# Patient Record
Sex: Female | Born: 1960
Health system: Southern US, Community
[De-identification: ages and names within clinical notes are randomized; demographics above are authoritative.]

## PROBLEM LIST (undated history)

## (undated) DIAGNOSIS — E039 Hypothyroidism, unspecified: Secondary | ICD-10-CM

## (undated) DIAGNOSIS — M542 Cervicalgia: Secondary | ICD-10-CM

## (undated) DIAGNOSIS — F329 Major depressive disorder, single episode, unspecified: Secondary | ICD-10-CM

## (undated) DIAGNOSIS — G629 Polyneuropathy, unspecified: Principal | ICD-10-CM

## (undated) DIAGNOSIS — F411 Generalized anxiety disorder: Secondary | ICD-10-CM

## (undated) DIAGNOSIS — D649 Anemia, unspecified: Secondary | ICD-10-CM

## (undated) DIAGNOSIS — M5416 Radiculopathy, lumbar region: Secondary | ICD-10-CM

## (undated) DIAGNOSIS — L309 Dermatitis, unspecified: Secondary | ICD-10-CM

## (undated) DIAGNOSIS — M8580 Other specified disorders of bone density and structure, unspecified site: Secondary | ICD-10-CM

## (undated) DIAGNOSIS — M1990 Unspecified osteoarthritis, unspecified site: Secondary | ICD-10-CM

## (undated) DIAGNOSIS — F3289 Other specified depressive episodes: Secondary | ICD-10-CM

## (undated) HISTORY — DX: Anemia, unspecified: D64.9

## (undated) HISTORY — DX: Polyneuropathy, unspecified: G62.9

## (undated) HISTORY — PX: COLONOSCOPY: SHX174

## (undated) HISTORY — DX: Hypothyroidism, unspecified: E03.9

## (undated) HISTORY — DX: Other specified depressive episodes: F32.89

## (undated) HISTORY — PX: BREAST SURGERY: SHX581

## (undated) HISTORY — DX: Unspecified osteoarthritis, unspecified site: M19.90

## (undated) HISTORY — DX: Generalized anxiety disorder: F41.1

## (undated) HISTORY — DX: Other specified disorders of bone density and structure, unspecified site: M85.80

## (undated) HISTORY — DX: Dermatitis, unspecified: L30.9

## (undated) HISTORY — DX: Cervicalgia: M54.2

## (undated) HISTORY — PX: TONSILLECTOMY: SHX5217

## (undated) HISTORY — DX: Radiculopathy, lumbar region: M54.16

## (undated) HISTORY — DX: Major depressive disorder, single episode, unspecified: F32.9

---

## 2003-06-17 ENCOUNTER — Encounter: Payer: Self-pay | Admitting: Internal Medicine

## 2003-10-02 ENCOUNTER — Other Ambulatory Visit: Admission: RE | Admit: 2003-10-02 | Discharge: 2003-10-02 | Payer: Self-pay | Admitting: Family Medicine

## 2003-10-09 ENCOUNTER — Encounter: Admission: RE | Admit: 2003-10-09 | Discharge: 2003-10-23 | Payer: Self-pay | Admitting: Neurology

## 2003-10-14 ENCOUNTER — Ambulatory Visit (HOSPITAL_COMMUNITY): Admission: RE | Admit: 2003-10-14 | Discharge: 2003-10-14 | Payer: Self-pay | Admitting: Family Medicine

## 2003-10-24 ENCOUNTER — Encounter: Admission: RE | Admit: 2003-10-24 | Discharge: 2003-12-10 | Payer: Self-pay | Admitting: Neurology

## 2003-10-30 ENCOUNTER — Encounter: Admission: RE | Admit: 2003-10-30 | Discharge: 2003-10-30 | Payer: Self-pay | Admitting: Family Medicine

## 2003-12-18 ENCOUNTER — Emergency Department (HOSPITAL_COMMUNITY): Admission: EM | Admit: 2003-12-18 | Discharge: 2003-12-18 | Payer: Self-pay | Admitting: Emergency Medicine

## 2004-10-06 ENCOUNTER — Other Ambulatory Visit: Admission: RE | Admit: 2004-10-06 | Discharge: 2004-10-06 | Payer: Self-pay | Admitting: Physician Assistant

## 2005-01-26 ENCOUNTER — Other Ambulatory Visit: Admission: RE | Admit: 2005-01-26 | Discharge: 2005-01-26 | Payer: Self-pay | Admitting: Obstetrics and Gynecology

## 2005-03-03 ENCOUNTER — Encounter: Admission: RE | Admit: 2005-03-03 | Discharge: 2005-03-03 | Payer: Self-pay | Admitting: Obstetrics and Gynecology

## 2006-09-22 LAB — CONVERTED CEMR LAB: Pap Smear: NORMAL

## 2007-01-08 ENCOUNTER — Ambulatory Visit: Payer: Self-pay | Admitting: Internal Medicine

## 2007-05-27 ENCOUNTER — Telehealth: Payer: Self-pay | Admitting: Internal Medicine

## 2007-06-13 ENCOUNTER — Ambulatory Visit: Payer: Self-pay | Admitting: Internal Medicine

## 2007-06-13 LAB — CONVERTED CEMR LAB
ALT: 22 units/L (ref 0–35)
AST: 24 units/L (ref 0–37)
Albumin: 4.1 g/dL (ref 3.5–5.2)
Alkaline Phosphatase: 47 units/L (ref 39–117)
BUN: 18 mg/dL (ref 6–23)
Basophils Absolute: 0 10*3/uL (ref 0.0–0.1)
Basophils Relative: 0.1 % (ref 0.0–1.0)
Bilirubin Urine: NEGATIVE
Bilirubin, Direct: 0.2 mg/dL (ref 0.0–0.3)
Blood in Urine, dipstick: NEGATIVE
CO2: 30 meq/L (ref 19–32)
Calcium: 9.5 mg/dL (ref 8.4–10.5)
Chloride: 105 meq/L (ref 96–112)
Cholesterol: 174 mg/dL (ref 0–200)
Creatinine, Ser: 0.6 mg/dL (ref 0.4–1.2)
Eosinophils Absolute: 0.1 10*3/uL (ref 0.0–0.6)
Eosinophils Relative: 1.7 % (ref 0.0–5.0)
GFR calc Af Amer: 138 mL/min
GFR calc non Af Amer: 114 mL/min
Glucose, Bld: 102 mg/dL — ABNORMAL HIGH (ref 70–99)
Glucose, Urine, Semiquant: NEGATIVE
HCT: 35.5 % — ABNORMAL LOW (ref 36.0–46.0)
HDL: 67.1 mg/dL (ref >39.0)
Hemoglobin: 12.3 g/dL (ref 12.0–15.0)
Ketones, urine, test strip: NEGATIVE
LDL Cholesterol: 96 mg/dL (ref 0–99)
Lymphocytes Relative: 18.9 % (ref 12.0–46.0)
MCHC: 34.6 g/dL (ref 30.0–36.0)
MCV: 89.5 fL (ref 78.0–100.0)
Monocytes Absolute: 0.5 10*3/uL (ref 0.2–0.7)
Monocytes Relative: 5.3 % (ref 3.0–11.0)
Neutro Abs: 6.3 10*3/uL (ref 1.4–7.7)
Neutrophils Relative %: 74 % (ref 43.0–77.0)
Nitrite: NEGATIVE
Platelets: 302 10*3/uL (ref 150–400)
Potassium: 4 meq/L (ref 3.5–5.1)
Protein, U semiquant: NEGATIVE
RBC: 3.97 M/uL (ref 3.87–5.11)
RDW: 12.5 % (ref 11.5–14.6)
Sodium: 143 meq/L (ref 135–145)
Specific Gravity, Urine: <1.005
TSH: 2.74 microintl units/mL (ref 0.35–5.50)
Total Bilirubin: 2 mg/dL — ABNORMAL HIGH (ref 0.3–1.2)
Total CHOL/HDL Ratio: 2.6
Total Protein: 7 g/dL (ref 6.0–8.3)
Triglycerides: 55 mg/dL (ref 0–149)
Urobilinogen, UA: 0.2
VLDL: 11 mg/dL (ref 0–40)
WBC: 8.5 10*3/uL (ref 4.5–10.5)
pH: 5.5

## 2007-06-17 DIAGNOSIS — E039 Hypothyroidism, unspecified: Secondary | ICD-10-CM | POA: Insufficient documentation

## 2007-06-17 DIAGNOSIS — F329 Major depressive disorder, single episode, unspecified: Secondary | ICD-10-CM | POA: Insufficient documentation

## 2007-06-17 DIAGNOSIS — F32A Depression, unspecified: Secondary | ICD-10-CM | POA: Insufficient documentation

## 2007-06-20 ENCOUNTER — Ambulatory Visit: Payer: Self-pay | Admitting: Internal Medicine

## 2007-06-20 DIAGNOSIS — F411 Generalized anxiety disorder: Secondary | ICD-10-CM | POA: Insufficient documentation

## 2007-09-27 ENCOUNTER — Telehealth: Payer: Self-pay | Admitting: Internal Medicine

## 2007-10-24 LAB — CONVERTED CEMR LAB: Pap Smear: NORMAL

## 2008-06-16 ENCOUNTER — Ambulatory Visit: Payer: Self-pay | Admitting: Internal Medicine

## 2008-06-17 ENCOUNTER — Encounter: Payer: Self-pay | Admitting: Internal Medicine

## 2008-06-17 LAB — CONVERTED CEMR LAB
BUN: 13 mg/dL (ref 6–23)
Basophils Absolute: 0 10*3/uL (ref 0.0–0.1)
Basophils Relative: 0 % (ref 0.0–3.0)
CO2: 28 meq/L (ref 19–32)
Calcium: 8.9 mg/dL (ref 8.4–10.5)
Chloride: 110 meq/L (ref 96–112)
Creatinine, Ser: 0.5 mg/dL (ref 0.4–1.2)
Cyclic Citrullin Peptide Ab: 0.5 units (ref <7)
Eosinophils Absolute: 0.1 10*3/uL (ref 0.0–0.7)
Eosinophils Relative: 1.9 % (ref 0.0–5.0)
GFR calc Af Amer: 170 mL/min
GFR calc non Af Amer: 141 mL/min
Glucose, Bld: 98 mg/dL (ref 70–99)
HCT: 36.3 % (ref 36.0–46.0)
Hemoglobin: 12.7 g/dL (ref 12.0–15.0)
Lymphocytes Relative: 39.9 % (ref 12.0–46.0)
MCHC: 34.9 g/dL (ref 30.0–36.0)
MCV: 90.2 fL (ref 78.0–100.0)
Monocytes Absolute: 0.4 10*3/uL (ref 0.1–1.0)
Monocytes Relative: 7.1 % (ref 3.0–12.0)
Neutro Abs: 2.7 10*3/uL (ref 1.4–7.7)
Neutrophils Relative %: 51.1 % (ref 43.0–77.0)
Platelets: 302 10*3/uL (ref 150–400)
Potassium: 3.5 meq/L (ref 3.5–5.1)
RBC: 4.02 M/uL (ref 3.87–5.11)
RDW: 11.7 % (ref 11.5–14.6)
Rhuematoid fact SerPl-aCnc: <20 intl units/mL — ABNORMAL LOW (ref 0.0–20.0)
Sodium: 140 meq/L (ref 135–145)
WBC: 5.3 10*3/uL (ref 4.5–10.5)

## 2008-06-18 LAB — CONVERTED CEMR LAB: Anti Nuclear Antibody(ANA): NEGATIVE

## 2008-07-29 ENCOUNTER — Ambulatory Visit: Payer: Self-pay | Admitting: Internal Medicine

## 2008-07-29 LAB — CONVERTED CEMR LAB
ALT: 20 units/L (ref 0–35)
AST: 22 units/L (ref 0–37)
Albumin: 4.1 g/dL (ref 3.5–5.2)
Alkaline Phosphatase: 36 units/L — ABNORMAL LOW (ref 39–117)
BUN: 14 mg/dL (ref 6–23)
Basophils Absolute: 0 10*3/uL (ref 0.0–0.1)
Basophils Relative: 0.4 % (ref 0.0–3.0)
Bilirubin Urine: NEGATIVE
Bilirubin, Direct: 0.1 mg/dL (ref 0.0–0.3)
Blood in Urine, dipstick: NEGATIVE
CO2: 28 meq/L (ref 19–32)
Calcium: 8.7 mg/dL (ref 8.4–10.5)
Chloride: 105 meq/L (ref 96–112)
Cholesterol: 190 mg/dL (ref 0–200)
Creatinine, Ser: 0.6 mg/dL (ref 0.4–1.2)
Eosinophils Absolute: 0.1 10*3/uL (ref 0.0–0.7)
Eosinophils Relative: 2.2 % (ref 0.0–5.0)
GFR calc Af Amer: 137 mL/min
GFR calc non Af Amer: 113 mL/min
Glucose, Bld: 108 mg/dL — ABNORMAL HIGH (ref 70–99)
Glucose, Urine, Semiquant: NEGATIVE
HCT: 35.2 % — ABNORMAL LOW (ref 36.0–46.0)
HDL: 57.5 mg/dL (ref >39.0)
Hemoglobin: 12.1 g/dL (ref 12.0–15.0)
Ketones, urine, test strip: NEGATIVE
LDL Cholesterol: 113 mg/dL — ABNORMAL HIGH (ref 0–99)
Lymphocytes Relative: 41.9 % (ref 12.0–46.0)
MCHC: 34.2 g/dL (ref 30.0–36.0)
MCV: 88.7 fL (ref 78.0–100.0)
Monocytes Absolute: 0.3 10*3/uL (ref 0.1–1.0)
Monocytes Relative: 6.6 % (ref 3.0–12.0)
Neutro Abs: 2.4 10*3/uL (ref 1.4–7.7)
Neutrophils Relative %: 48.9 % (ref 43.0–77.0)
Nitrite: NEGATIVE
Platelets: 294 10*3/uL (ref 150–400)
Potassium: 3.8 meq/L (ref 3.5–5.1)
Protein, U semiquant: NEGATIVE
RBC: 3.97 M/uL (ref 3.87–5.11)
RDW: 11.8 % (ref 11.5–14.6)
Sodium: 141 meq/L (ref 135–145)
Specific Gravity, Urine: 1.015
TSH: 2.71 microintl units/mL (ref 0.35–5.50)
Total Bilirubin: 1.9 mg/dL — ABNORMAL HIGH (ref 0.3–1.2)
Total CHOL/HDL Ratio: 3.3
Total Protein: 7.2 g/dL (ref 6.0–8.3)
Triglycerides: 98 mg/dL (ref 0–149)
Urobilinogen, UA: 0.2
VLDL: 20 mg/dL (ref 0–40)
WBC: 4.8 10*3/uL (ref 4.5–10.5)
pH: 7

## 2008-08-04 ENCOUNTER — Ambulatory Visit: Payer: Self-pay | Admitting: Internal Medicine

## 2008-12-18 ENCOUNTER — Encounter: Payer: Self-pay | Admitting: Internal Medicine

## 2009-01-05 ENCOUNTER — Encounter: Payer: Self-pay | Admitting: Internal Medicine

## 2009-02-23 ENCOUNTER — Encounter: Payer: Self-pay | Admitting: Internal Medicine

## 2009-07-22 ENCOUNTER — Ambulatory Visit: Payer: Self-pay | Admitting: Internal Medicine

## 2009-07-22 LAB — CONVERTED CEMR LAB
ALT: 16 units/L (ref 0–35)
AST: 20 units/L (ref 0–37)
Albumin: 3.8 g/dL (ref 3.5–5.2)
Alkaline Phosphatase: 38 units/L — ABNORMAL LOW (ref 39–117)
BUN: 14 mg/dL (ref 6–23)
Basophils Absolute: 0 10*3/uL (ref 0.0–0.1)
Basophils Relative: 0.7 % (ref 0.0–3.0)
Bilirubin Urine: NEGATIVE
Bilirubin, Direct: 0 mg/dL (ref 0.0–0.3)
Blood in Urine, dipstick: NEGATIVE
CO2: 30 meq/L (ref 19–32)
Calcium: 8.8 mg/dL (ref 8.4–10.5)
Chloride: 102 meq/L (ref 96–112)
Cholesterol: 172 mg/dL (ref 0–200)
Creatinine, Ser: 0.6 mg/dL (ref 0.4–1.2)
Eosinophils Absolute: 0.1 10*3/uL (ref 0.0–0.7)
Eosinophils Relative: 1.5 % (ref 0.0–5.0)
GFR calc non Af Amer: 112.94 mL/min (ref >60)
Glucose, Bld: 96 mg/dL (ref 70–99)
Glucose, Urine, Semiquant: NEGATIVE
HCT: 34.3 % — ABNORMAL LOW (ref 36.0–46.0)
HDL: 73.6 mg/dL (ref >39.00)
Hemoglobin: 11.5 g/dL — ABNORMAL LOW (ref 12.0–15.0)
Ketones, urine, test strip: NEGATIVE
LDL Cholesterol: 80 mg/dL (ref 0–99)
Lymphocytes Relative: 43.2 % (ref 12.0–46.0)
Lymphs Abs: 2.4 10*3/uL (ref 0.7–4.0)
MCHC: 33.5 g/dL (ref 30.0–36.0)
MCV: 89.6 fL (ref 78.0–100.0)
Monocytes Absolute: 0.3 10*3/uL (ref 0.1–1.0)
Monocytes Relative: 5.8 % (ref 3.0–12.0)
Neutro Abs: 2.7 10*3/uL (ref 1.4–7.7)
Neutrophils Relative %: 48.8 % (ref 43.0–77.0)
Nitrite: NEGATIVE
Platelets: 252 10*3/uL (ref 150.0–400.0)
Potassium: 4.2 meq/L (ref 3.5–5.1)
Protein, U semiquant: NEGATIVE
RBC: 3.83 M/uL — ABNORMAL LOW (ref 3.87–5.11)
RDW: 12.1 % (ref 11.5–14.6)
Sodium: 137 meq/L (ref 135–145)
Specific Gravity, Urine: 1.01
TSH: 3.02 microintl units/mL (ref 0.35–5.50)
Total Bilirubin: 1.2 mg/dL (ref 0.3–1.2)
Total CHOL/HDL Ratio: 2
Total Protein: 6.6 g/dL (ref 6.0–8.3)
Triglycerides: 90 mg/dL (ref 0.0–149.0)
Urobilinogen, UA: 0.2
VLDL: 18 mg/dL (ref 0.0–40.0)
WBC: 5.5 10*3/uL (ref 4.5–10.5)
pH: 7

## 2009-08-06 ENCOUNTER — Ambulatory Visit: Payer: Self-pay | Admitting: Internal Medicine

## 2009-08-06 DIAGNOSIS — D649 Anemia, unspecified: Secondary | ICD-10-CM | POA: Insufficient documentation

## 2009-08-09 LAB — CONVERTED CEMR LAB
Basophils Absolute: 0.1 10*3/uL (ref 0.0–0.1)
Basophils Relative: 1 % (ref 0.0–3.0)
Eosinophils Absolute: 0.1 10*3/uL (ref 0.0–0.7)
Eosinophils Relative: 1.4 % (ref 0.0–5.0)
Ferritin: 47 ng/mL (ref 10.0–291.0)
Folate: 8.1 ng/mL
HCT: 34.3 % — ABNORMAL LOW (ref 36.0–46.0)
Hemoglobin: 11.9 g/dL — ABNORMAL LOW (ref 12.0–15.0)
Iron: 58 ug/dL (ref 42–145)
Lymphocytes Relative: 35.9 % (ref 12.0–46.0)
Lymphs Abs: 2 10*3/uL (ref 0.7–4.0)
MCHC: 34.8 g/dL (ref 30.0–36.0)
MCV: 88 fL (ref 78.0–100.0)
Monocytes Absolute: 0.3 10*3/uL (ref 0.1–1.0)
Monocytes Relative: 5.9 % (ref 3.0–12.0)
Neutro Abs: 3 10*3/uL (ref 1.4–7.7)
Neutrophils Relative %: 55.8 % (ref 43.0–77.0)
Platelets: 264 10*3/uL (ref 150.0–400.0)
RBC: 3.9 M/uL (ref 3.87–5.11)
RDW: 12.1 % (ref 11.5–14.6)
Saturation Ratios: 14.5 % — ABNORMAL LOW (ref 20.0–50.0)
Transferrin: 285.1 mg/dL (ref 212.0–360.0)
Vitamin B-12: 506 pg/mL (ref 211–911)
WBC: 5.5 10*3/uL (ref 4.5–10.5)

## 2009-08-27 ENCOUNTER — Ambulatory Visit: Payer: Self-pay | Admitting: Gastroenterology

## 2009-08-30 ENCOUNTER — Telehealth: Payer: Self-pay | Admitting: Gastroenterology

## 2009-08-31 ENCOUNTER — Ambulatory Visit: Payer: Self-pay | Admitting: Gastroenterology

## 2009-08-31 LAB — HM COLONOSCOPY

## 2009-09-01 ENCOUNTER — Ambulatory Visit: Payer: Self-pay | Admitting: Gastroenterology

## 2009-09-01 LAB — CONVERTED CEMR LAB: Fecal Occult Bld: NEGATIVE

## 2009-09-02 ENCOUNTER — Encounter: Payer: Self-pay | Admitting: Gastroenterology

## 2009-09-24 ENCOUNTER — Ambulatory Visit: Payer: Self-pay | Admitting: Internal Medicine

## 2010-02-20 LAB — HM MAMMOGRAPHY: HM Mammogram: NORMAL

## 2010-02-20 LAB — CONVERTED CEMR LAB

## 2010-02-23 ENCOUNTER — Encounter: Payer: Self-pay | Admitting: Internal Medicine

## 2010-07-07 ENCOUNTER — Telehealth: Payer: Self-pay | Admitting: *Deleted

## 2010-08-03 ENCOUNTER — Ambulatory Visit: Payer: Self-pay | Admitting: Internal Medicine

## 2010-08-03 LAB — CONVERTED CEMR LAB
ALT: 19 units/L (ref 0–35)
AST: 21 units/L (ref 0–37)
Albumin: 4.2 g/dL (ref 3.5–5.2)
Alkaline Phosphatase: 45 units/L (ref 39–117)
BUN: 14 mg/dL (ref 6–23)
Basophils Absolute: 0 10*3/uL (ref 0.0–0.1)
Basophils Relative: 0.8 % (ref 0.0–3.0)
Bilirubin Urine: NEGATIVE
Bilirubin, Direct: 0.2 mg/dL (ref 0.0–0.3)
CO2: 31 meq/L (ref 19–32)
Calcium: 9 mg/dL (ref 8.4–10.5)
Chloride: 105 meq/L (ref 96–112)
Cholesterol: 204 mg/dL — ABNORMAL HIGH (ref 0–200)
Creatinine, Ser: 0.5 mg/dL (ref 0.4–1.2)
Direct LDL: 105.3 mg/dL
Eosinophils Absolute: 0.1 10*3/uL (ref 0.0–0.7)
Eosinophils Relative: 2.1 % (ref 0.0–5.0)
GFR calc non Af Amer: 129.77 mL/min (ref >60)
Glucose, Bld: 91 mg/dL (ref 70–99)
HCT: 35.2 % — ABNORMAL LOW (ref 36.0–46.0)
HDL: 70.9 mg/dL (ref >39.00)
Hemoglobin, Urine: NEGATIVE
Hemoglobin: 12.3 g/dL (ref 12.0–15.0)
Ketones, ur: NEGATIVE mg/dL
Lymphocytes Relative: 44.8 % (ref 12.0–46.0)
Lymphs Abs: 1.9 10*3/uL (ref 0.7–4.0)
MCHC: 35 g/dL (ref 30.0–36.0)
MCV: 89.2 fL (ref 78.0–100.0)
Monocytes Absolute: 0.3 10*3/uL (ref 0.1–1.0)
Monocytes Relative: 7.4 % (ref 3.0–12.0)
Neutro Abs: 1.9 10*3/uL (ref 1.4–7.7)
Neutrophils Relative %: 44.9 % (ref 43.0–77.0)
Nitrite: NEGATIVE
Platelets: 250 10*3/uL (ref 150.0–400.0)
Potassium: 4.3 meq/L (ref 3.5–5.1)
RBC: 3.94 M/uL (ref 3.87–5.11)
RDW: 13.5 % (ref 11.5–14.6)
Sodium: 142 meq/L (ref 135–145)
Specific Gravity, Urine: 1.01 (ref 1.000–1.030)
TSH: 2.51 microintl units/mL (ref 0.35–5.50)
Total Bilirubin: 1.9 mg/dL — ABNORMAL HIGH (ref 0.3–1.2)
Total CHOL/HDL Ratio: 3
Total Protein, Urine: NEGATIVE mg/dL
Total Protein: 7.1 g/dL (ref 6.0–8.3)
Triglycerides: 85 mg/dL (ref 0.0–149.0)
Urine Glucose: NEGATIVE mg/dL
Urobilinogen, UA: 0.2 (ref 0.0–1.0)
VLDL: 17 mg/dL (ref 0.0–40.0)
WBC: 4.3 10*3/uL — ABNORMAL LOW (ref 4.5–10.5)
pH: 6.5 (ref 5.0–8.0)

## 2010-08-08 ENCOUNTER — Ambulatory Visit: Payer: Self-pay | Admitting: Internal Medicine

## 2010-08-08 ENCOUNTER — Encounter: Payer: Self-pay | Admitting: Internal Medicine

## 2010-10-10 ENCOUNTER — Ambulatory Visit: Payer: Self-pay | Admitting: Internal Medicine

## 2010-10-10 DIAGNOSIS — M542 Cervicalgia: Secondary | ICD-10-CM | POA: Insufficient documentation

## 2010-10-13 ENCOUNTER — Encounter: Payer: Self-pay | Admitting: Internal Medicine

## 2010-11-12 ENCOUNTER — Encounter: Payer: Self-pay | Admitting: Family Medicine

## 2010-11-22 NOTE — Letter (Signed)
Summary: [facility]  [facility]   Imported By: Maryln Gottron 03/02/2010 13:10:55  _____________________________________________________________________  External Attachment:    Type:   Image     Comment:   External Document

## 2010-11-22 NOTE — Assessment & Plan Note (Signed)
Summary: New pt CPX/bcbs/#/cd   Vital Signs:  Patient profile:   50 year old female Height:      61.25 inches (155.57 cm) Weight:      120.4 pounds (54.73 kg) BMI:     22.65 O2 Sat:      97 % on Room air Temp:     97.4 degrees F (36.33 degrees C) oral Pulse rate:   68 / minute BP sitting:   102 / 68  (left arm) Cuff size:   regular  Vitals Entered By: Orlan Leavens RMA (August 08, 2010 2:12 PM)  O2 Flow:  Room air CC: New patient CPX/ Transferring from Dr. Cato Mulligan Is Patient Diabetic? No Pain Assessment Patient in pain? no        Primary Care Provider:  Birdie Sons, MD  CC:  New patient CPX/ Transferring from Dr. Cato Mulligan.  History of Present Illness: new to me and our office but known to our group, here to est care  patient is here today for annual physical. Patient feels well and has no complaints.   chronic med issues: hypothyroid - reports compliance with ongoing medical treatment and no changes in medication dose or frequency. denies adverse side effects related to current therapy.   Preventive Screening-Counseling & Management  Alcohol-Tobacco     Alcohol drinks/day: <1     Alcohol Counseling: not indicated; use of alcohol is not excessive or problematic     Smoking Status: quit > 6 months     Year Quit: 1987     Tobacco Counseling: to remain off tobacco products  Caffeine-Diet-Exercise     Does Patient Exercise: yes     Exercise Counseling: not indicated; exercise is adequate  Safety-Violence-Falls     Seat Belt Counseling: not indicated; patient wears seat belts     Violence Counseling: not indicated; no violence risk noted     Fall Risk Counseling: not indicated; no significant falls noted  Clinical Review Panels:  Prevention   Last Mammogram:  No specific mammographic evidence of malignancy.   (02/20/2010)   Last Pap Smear:  Interpretation Result:Negative for intraepithelial Lesion or Malignancy.    (02/20/2010)   Last Colonoscopy:  DONE  (08/31/2009)  Immunizations   Last Tetanus Booster:  Historical (10/23/2001)   Last Flu Vaccine:  Fluvax 3+ (08/08/2010)  Lipid Management   Cholesterol:  204 (08/03/2010)   LDL (bad choesterol):  80 (07/22/2009)   HDL (good cholesterol):  70.90 (08/03/2010)  CBC   WBC:  4.3 (08/03/2010)   RBC:  3.94 (08/03/2010)   Hgb:  12.3 (08/03/2010)   Hct:  35.2 (08/03/2010)   Platelets:  250.0 (08/03/2010)   MCV  89.2 (08/03/2010)   MCHC  35.0 (08/03/2010)   RDW  13.5 (08/03/2010)   PMN:  44.9 (08/03/2010)   Lymphs:  44.8 (08/03/2010)   Monos:  7.4 (08/03/2010)   Eosinophils:  2.1 (08/03/2010)   Basophil:  0.8 (08/03/2010)  Complete Metabolic Panel   Glucose:  91 (08/03/2010)   Sodium:  142 (08/03/2010)   Potassium:  4.3 (08/03/2010)   Chloride:  105 (08/03/2010)   CO2:  31 (08/03/2010)   BUN:  14 (08/03/2010)   Creatinine:  0.5 (08/03/2010)   Albumin:  4.2 (08/03/2010)   Total Protein:  7.1 (08/03/2010)   Calcium:  9.0 (08/03/2010)   Total Bili:  1.9 (08/03/2010)   Alk Phos:  45 (08/03/2010)   SGPT (ALT):  19 (08/03/2010)   SGOT (AST):  21 (08/03/2010)   Current Medications (verified):  1)  Levothyroxine Sodium 75 Mcg  Tabs (Levothyroxine Sodium) .... One By Mouth Daily 2)  Neurontin 300 Mg  Caps (Gabapentin) .... Take 1 Tablet By Mouth Once A Day and As Needed 3)  Melatonin 1 Mg Tabs (Melatonin) .... One Tab As Needed 4)  Glucosamine-Chondroitin 1500-1200 Mg/52ml Liqd (Glucosamine-Chondroitin) .... Once Daily 5)  Multivitamins  Tabs (Multiple Vitamin) .... Once Daily 6)  Calcium 500mg -Vit D 800 Iu .... Once Daily 7)  Vitamin D3 2000 Unit Caps (Cholecalciferol) .... Once Daily 8)  Allergy 4 Mg Tabs (Chlorpheniramine Maleate) .... As Needed 9)  Lexapro 10 Mg Tabs (Escitalopram Oxalate) .Marland Kitchen.. 1 Once Daily  Allergies (verified): 1)  ! Pcn 2)  ! Macrodantin  Past History:  Past medical, surgical, family and social histories (including risk factors) reviewed, and no  changes noted (except as noted below).  Past Medical History: Anxiety/depression Hypothyroidism Anemia Arthritis  Md roster: GI - [facility] - [facility] - bey ([place] neuro)  Past Surgical History: breast reduction Tonsillectomy    Family History: Reviewed history from 08/27/2009 and no changes required. father RA, kidney failure died age 22 mother alive and well  (some question of RA) Family History of Colon Cancer:GM Family History of Kidney Disease:Father  Social History: Reviewed history from 08/27/2009 and no changes required. Married, lives with spouse; no children originally from [place] Former Smoker, quit 1987 Alcohol use-yes Regular exercise-yes Occupation: Housewife  Review of Systems       see HPI above. I have reviewed all other systems and they were negative.   Physical Exam  General:  alert, well-developed, well-nourished, and cooperative to examination.    Head:  Normocephalic and atraumatic without obvious abnormalities. No apparent alopecia or balding. Eyes:  vision grossly intact; pupils equal, round and reactive to light.  conjunctiva and lids normal.    Ears:  normal pinnae bilaterally, without erythema, swelling, or tenderness to palpation. TMs clear, without effusion, or cerumen impaction. Hearing grossly normal bilaterally  Mouth:  teeth and gums in good repair; mucous membranes moist, without lesions or ulcers. oropharynx clear without exudate, no erythema.  Neck:  supple, full ROM, no masses, no thyromegaly; no thyroid nodules or tenderness. no JVD or carotid bruits.   Lungs:  normal respiratory effort, no intercostal retractions or use of accessory muscles; normal breath sounds bilaterally - no crackles and no wheezes.    Heart:  normal rate, regular rhythm, no murmur, and no rub. BLE without edema. normal DP pulses and normal cap refill in all 4 extremities    Abdomen:  soft, non-tender, normal bowel sounds, no distention; no masses and  no appreciable hepatomegaly or splenomegaly.   Genitalia:  defer gyn Msk:  No deformity or scoliosis noted of thoracic or lumbar spine.   Neurologic:  alert & oriented X3 and cranial nerves II-XII symetrically intact.  strength normal in all extremities, sensation intact to light touch, and gait normal. speech fluent without dysarthria or aphasia; follows commands with good comprehension.  Skin:  no rashes, vesicles, ulcers, or erythema. No nodules or irregularity to palpation.  Psych:  Oriented X3, memory intact for recent and remote, normally interactive, good eye contact, not anxious appearing, not depressed appearing, and not agitated.      Impression & Recommendations:  Problem # 1:  EXAMINATION, ROUTINE MEDICAL (ICD-V70.0) Patient has been counseled on age-appropriate routine health concerns for screening and prevention. These are reviewed and up-to-date. Immunizations are up-to-date or declined. Labs and ECG reviewed.  Orders: EKG  w/ Interpretation (93000)  Complete Medication List: 1)  Levothyroxine Sodium 75 Mcg Tabs (Levothyroxine sodium) .... One by mouth daily 2)  Neurontin 300 Mg Caps (Gabapentin) .... Take 1 tablet by mouth once a day and as needed 3)  Melatonin 1 Mg Tabs (Melatonin) .... One tab as needed 4)  Glucosamine-chondroitin 1500-1200 Mg/17ml Liqd (Glucosamine-chondroitin) .... Once daily 5)  Multivitamins Tabs (Multiple vitamin) .... Once daily 6)  Calcium 500mg -vit D 800 Iu  .... Once daily 7)  Vitamin D3 2000 Unit Caps (Cholecalciferol) .... Once daily 8)  Allergy 4 Mg Tabs (Chlorpheniramine maleate) .... As needed 9)  Lexapro 10 Mg Tabs (Escitalopram oxalate) .Marland Kitchen.. 1 once daily 10)  Meloxicam 15 Mg Tabs (Meloxicam) .Marland Kitchen.. 1 by mouth once daily x 7 days, then as needed for pain  Other Orders: Admin 1st Vaccine (78295) Flu Vaccine 17yrs + (62130)  Patient Instructions: 1)  it was good to meet you today. 2)  labs reviewed - work on cholesterol with diet and  exercise changes as discussed - exam and EKG look great 3)  use meloxicam for back and shoulder ache - your prescription has been electronically submitted to your pharmacy. Please take as directed. Contact our office if you believe you're having problems with the medication(s).  4)  omega 3 supplements and probiotics ok to use as discussed 5)  flu shot done today 6)  Please schedule a follow-up appointment annually for medicalphysical and labs, call sooner if problems.  Prescriptions: MELOXICAM 15 MG TABS (MELOXICAM) 1 by mouth once daily x 7 days, then as needed for pain  #30 x 0   Entered and Authorized by:   Newt Lukes MD   Signed by:   Newt Lukes MD on 08/08/2010   Method used:   Electronically to        [facility] # [id number] (retail)       [address]       [place], [place]  [address]       Ph: [phone]       Fax: [phone]   RxID:   [id number] LEVOTHYROXINE SODIUM 75 MCG  TABS (LEVOTHYROXINE SODIUM) one by mouth daily  #30 x 11   Entered by:   Orlan Leavens RMA   Authorized by:   Newt Lukes MD   Signed by:   Orlan Leavens RMA on 08/08/2010   Method used:   Electronically to        [facility] # [id number]* (retail)       [address]       [place], [place]  [address]       Ph: [phone]       Fax: [phone]   RxID:   [id number]    Orders Added: 1)  EKG w/ Interpretation [93000] 2)  Admin 1st Vaccine [90471] 3)  Flu Vaccine 41yrs + [42706] 4)  Est. Patient 40-64 years [23762]  Flu Vaccine Consent Questions     Do you have a history of severe allergic reactions to this vaccine? no    Any prior history of allergic reactions to egg and/or gelatin? no    Do you have a sensitivity to the preservative Thimersol? no    Do you have a past history of Guillan-Barre Syndrome? no    Do you currently have an acute febrile illness? no    Have you ever had a severe reaction to latex? no    Vaccine information given  and explained  to patient? yes    Are you currently pregnant? no    Lot Number:[id number]   Exp Date:04/22/2011   Site Given  Left Deltoid IM  .lbflu   Pap Smear  Procedure date:  02/20/2010  Findings:      Interpretation Result:Negative for intraepithelial Lesion or Malignancy.     Mammogram  Procedure date:  02/20/2010  Findings:      No specific mammographic evidence of malignancy.

## 2010-11-22 NOTE — Progress Notes (Signed)
Summary: Pt req partial refill of Levothyroxin. Pt has ov on 08/12/10  Phone Note Call from Patient Call back at Home Phone [phone]   Caller: Patient Summary of Call: Pt called and said that she is coming in for office visit on 08/12/10 and labs on 08/06/10. Pt is req a partial refill of the Levothyroxin to last until her appt date. Pls call in to [facility] [address] [facility] of [address]. [phone] Initial call taken by: Lucy Antigua,  July 07, 2010 9:50 AM    Prescriptions: LEVOTHYROXINE SODIUM 75 MCG  TABS (LEVOTHYROXINE SODIUM) one by mouth daily  #30 x 0   Entered by:   Kern Reap CMA (AAMA)   Authorized by:   Birdie Sons MD   Signed by:   Kern Reap CMA (AAMA) on 07/07/2010   Method used:   Electronically to        [id number] (retail)       [address]       [place], [place]  [address]       Ph: [phone]       Fax: [phone]   RxID:   [id number]
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
## 2010-11-24 NOTE — Letter (Signed)
Summary: [facility]  [facility]   Imported By: Lester Burley 10/20/2010 11:09:07  _____________________________________________________________________  External Attachment:    Type:   Image     Comment:   External Document

## 2011-04-17 ENCOUNTER — Encounter: Payer: Self-pay | Admitting: Internal Medicine

## 2011-04-20 ENCOUNTER — Encounter: Payer: Self-pay | Admitting: Internal Medicine

## 2011-04-20 ENCOUNTER — Ambulatory Visit (INDEPENDENT_AMBULATORY_CARE_PROVIDER_SITE_OTHER): Payer: BC Managed Care – PPO | Admitting: Internal Medicine

## 2011-04-20 DIAGNOSIS — M5430 Sciatica, unspecified side: Secondary | ICD-10-CM | POA: Insufficient documentation

## 2011-04-20 DIAGNOSIS — F329 Major depressive disorder, single episode, unspecified: Secondary | ICD-10-CM

## 2011-04-20 DIAGNOSIS — M542 Cervicalgia: Secondary | ICD-10-CM

## 2011-04-20 MED ORDER — PREDNISONE (PAK) 10 MG PO TABS: 10.0000 mg | ORAL_TABLET | ORAL | Status: DC

## 2011-04-20 MED ORDER — MELOXICAM 15 MG PO TABS: 15.0000 mg | ORAL_TABLET | Freq: Every day | ORAL | Status: DC | PRN

## 2011-04-20 MED ORDER — METHOCARBAMOL 500 MG PO TABS: 500.0000 mg | ORAL_TABLET | Freq: Three times a day (TID) | ORAL | Status: DC

## 2011-04-20 NOTE — Patient Instructions (Signed)
It was good to see you today. we'll make referral to physical therapy. Our office will contact you regarding appointment(s) once made. Pred pak for inflammation of your sciatica as discussed - ok to use robaxin for muscle tightness as needed After done with pred pak, ok to use meloxcam as needed for pain Your prescription(s) have been submitted to your pharmacy. Please take as directed and contact our office if you believe you are having problem(s) with the medication(s).
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
## 2011-04-20 NOTE — Assessment & Plan Note (Signed)
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

## 2011-04-20 NOTE — Assessment & Plan Note (Signed)
Chronic pain - current flare with myofascial spasm, no neuro deficits on exam Resume meloxicam (after pred pak for sciatica - see next) and robaxin muscle relaxer Also refer to PT

## 2011-04-20 NOTE — Assessment & Plan Note (Signed)
Mild LLE symptoms - hx same pred pak x 6 days - erx done Also PT refer as for neck pain

## 2011-07-07 ENCOUNTER — Telehealth: Payer: Self-pay | Admitting: *Deleted

## 2011-07-07 DIAGNOSIS — Z0000 Encounter for general adult medical examination without abnormal findings: Secondary | ICD-10-CM

## 2011-07-07 NOTE — Telephone Encounter (Signed)
Received staff msg pt schedule cpx for 08/14/11. Need labs entered.Marland KitchenMarland KitchenMarland Kitchen9/14/12@11 :00am/LMB

## 2011-07-26 ENCOUNTER — Other Ambulatory Visit: Payer: Self-pay | Admitting: Internal Medicine

## 2011-08-09 ENCOUNTER — Other Ambulatory Visit (INDEPENDENT_AMBULATORY_CARE_PROVIDER_SITE_OTHER): Payer: BC Managed Care – PPO

## 2011-08-09 DIAGNOSIS — Z0000 Encounter for general adult medical examination without abnormal findings: Secondary | ICD-10-CM

## 2011-08-09 LAB — URINALYSIS, ROUTINE W REFLEX MICROSCOPIC
Bilirubin Urine: NEGATIVE
Nitrite: NEGATIVE
pH: 6 (ref 5.0–8.0)

## 2011-08-09 LAB — CBC WITH DIFFERENTIAL/PLATELET
Basophils Absolute: 0 10*3/uL (ref 0.0–0.1)
HCT: 36 % (ref 36.0–46.0)
Hemoglobin: 12.4 g/dL (ref 12.0–15.0)
Lymphs Abs: 2.2 10*3/uL (ref 0.7–4.0)
MCHC: 34.5 g/dL (ref 30.0–36.0)
MCV: 88.1 fl (ref 78.0–100.0)
Monocytes Absolute: 0.3 10*3/uL (ref 0.1–1.0)
Monocytes Relative: 5.9 % (ref 3.0–12.0)
Neutro Abs: 2.5 10*3/uL (ref 1.4–7.7)
RDW: 13 % (ref 11.5–14.6)

## 2011-08-09 LAB — BASIC METABOLIC PANEL
BUN: 19 mg/dL (ref 6–23)
CO2: 28 mEq/L (ref 19–32)
Chloride: 105 mEq/L (ref 96–112)
GFR: 148.46 mL/min (ref >60.00)
Glucose, Bld: 84 mg/dL (ref 70–99)
Potassium: 3.6 mEq/L (ref 3.5–5.1)
Sodium: 139 mEq/L (ref 135–145)

## 2011-08-09 LAB — LIPID PANEL
Cholesterol: 192 mg/dL (ref 0–200)
Triglycerides: 102 mg/dL (ref 0.0–149.0)

## 2011-08-09 LAB — HEPATIC FUNCTION PANEL
ALT: 16 U/L (ref 0–35)
AST: 25 U/L (ref 0–37)
Albumin: 4 g/dL (ref 3.5–5.2)

## 2011-08-14 ENCOUNTER — Encounter: Payer: BC Managed Care – PPO | Admitting: Internal Medicine

## 2011-08-23 ENCOUNTER — Encounter: Payer: Self-pay | Admitting: Internal Medicine

## 2011-08-23 ENCOUNTER — Ambulatory Visit (INDEPENDENT_AMBULATORY_CARE_PROVIDER_SITE_OTHER): Payer: BC Managed Care – PPO | Admitting: Internal Medicine

## 2011-08-23 VITALS — BP 100/62 | HR 77 | Temp 98.6°F | Ht 62.0 in | Wt 122.8 lb

## 2011-08-23 DIAGNOSIS — Z23 Encounter for immunization: Secondary | ICD-10-CM

## 2011-08-23 DIAGNOSIS — E039 Hypothyroidism, unspecified: Secondary | ICD-10-CM

## 2011-08-23 DIAGNOSIS — Z0000 Encounter for general adult medical examination without abnormal findings: Secondary | ICD-10-CM

## 2011-08-23 MED ORDER — LEVOTHYROXINE SODIUM 88 MCG PO TABS: 88.0000 ug | ORAL_TABLET | Freq: Every day | ORAL | Status: DC

## 2011-08-23 NOTE — Patient Instructions (Signed)
It was good to see you today. Your labs, exam and EKG all look good - more aerobic exercise is recommended - at least 4x/week (or more) Increase the dose synthroid dose as discussed and return in early January for recheck thyroid lab (lab only, no office visit) Other medications reviewed, no changes at this time. Flu shot today - Health Maintenance reviewed - up to date!  Please schedule followup annually for medical physical and labs, call sooner if problems.

## 2011-08-23 NOTE — Assessment & Plan Note (Signed)
TSH up on routine CPX labs Increase dose synth now to 88qd - erx done Recheck in 8-12 weeks - lab only Lab Results  Component Value Date   TSH 6.35* 08/09/2011

## 2011-08-23 NOTE — Progress Notes (Signed)
Subjective:    Patient ID: Sharon Stephenson, female    DOB: 26-Jan-1961, 50 y.o.   MRN: [id number]  HPI patient is here today for annual physical. Patient feels well and has no complaints.  Past Medical History  Diagnosis Date  . ANEMIA, OTHER UNSPEC   . ANXIETY   . DEPRESSION   . HYPOTHYROIDISM   . Cervicalgia     chronic - follows with neuro in [place]   Family History  Problem Relation Age of Onset  . Kidney disease Father   . Colon cancer Maternal Grandmother    History  Substance Use Topics  . Smoking status: Former Smoker    Quit date: 10/23/1985  . Smokeless tobacco: Not on file   Comment: Married, lives with spouse; no children. Originally from [place]  . Alcohol Use: Yes     Review of Systems Constitutional: Negative for fever or weight change.  Respiratory: Negative for cough and shortness of breath.   Cardiovascular: Negative for chest pain or palpitations.  Gastrointestinal: Negative for abdominal pain, no bowel changes.  Musculoskeletal: Negative for gait problem or joint swelling.  Skin: Negative for rash.  Neurological: Negative for dizziness or headache.  No other specific complaints in a complete review of systems (except as listed in HPI above).     Objective:   Physical Exam BP 100/62  Pulse 77  Temp(Src) 98.6 F (37 C) (Oral)  Ht 5\' 2"  (1.575 m)  Wt 122 lb 12.8 oz (55.702 kg)  BMI 22.46 kg/m2  SpO2 97% Wt Readings from Last 3 Encounters:  08/23/11 122 lb 12.8 oz (55.702 kg)  04/20/11 124 lb 12.8 oz (56.609 kg)  10/10/10 120 lb (54.432 kg)   Constitutional: She appears well-developed and well-nourished. No distress.  HENT: Head: Normocephalic and atraumatic. Ears: B TMs ok, no erythema or effusion; Nose: Nose normal.  Mouth/Throat: Oropharynx is clear and moist. No oropharyngeal exudate.  Eyes: Conjunctivae and EOM are normal. Pupils are equal, round, and reactive to light. No scleral icterus.  Neck: Normal range of motion. Neck supple. No JVD  present. No thyromegaly present.  Cardiovascular: Normal rate, regular rhythm and normal heart sounds.  No murmur heard. No BLE edema. Pulmonary/Chest: Effort normal and breath sounds normal. No respiratory distress. She has no wheezes.  Abdominal: Soft. Bowel sounds are normal. She exhibits no distension. There is no tenderness. no masses GU: defer to gyn (silva) Musculoskeletal: Normal range of motion, no joint effusions. No gross deformities Neurological: She is alert and oriented to person, place, and time. No cranial nerve deficit. Coordination normal.  Skin: Skin is warm and dry. No rash noted. No erythema.  Psychiatric: She has a normal mood and affect. Her behavior is normal. Judgment and thought content normal.    Lab Results  Component Value Date   WBC 5.3 08/09/2011   HGB 12.4 08/09/2011   HCT 36.0 08/09/2011   PLT 229.0 08/09/2011   GLUCOSE 84 08/09/2011   CHOL 192 08/09/2011   TRIG 102.0 08/09/2011   HDL 66.00 08/09/2011   LDLDIRECT 105.3 08/03/2010   LDLCALC 106* 08/09/2011   ALT 16 08/09/2011   AST 25 08/09/2011   NA 139 08/09/2011   K 3.6 08/09/2011   CL 105 08/09/2011   CREATININE 0.5 08/09/2011   BUN 19 08/09/2011   CO2 28 08/09/2011   TSH 6.35* 08/09/2011   EKG: NSR @65pmb  - no arrythmia or ischemic changes     Assessment & Plan:  CPX - v70.0 - Patient has  been counseled on age-appropriate routine health concerns for screening and prevention. These are reviewed and up-to-date. Immunizations are up-to-date or declined. Labs and ECG reviewed.

## 2011-10-30 ENCOUNTER — Other Ambulatory Visit (INDEPENDENT_AMBULATORY_CARE_PROVIDER_SITE_OTHER): Payer: BC Managed Care – PPO

## 2011-10-30 ENCOUNTER — Other Ambulatory Visit: Payer: Self-pay | Admitting: Internal Medicine

## 2011-10-30 DIAGNOSIS — E039 Hypothyroidism, unspecified: Secondary | ICD-10-CM

## 2011-10-30 MED ORDER — LEVOTHYROXINE SODIUM 75 MCG PO TABS: 75.0000 ug | ORAL_TABLET | Freq: Every day | ORAL | Status: DC

## 2012-02-14 ENCOUNTER — Encounter: Payer: Self-pay | Admitting: Internal Medicine

## 2012-02-14 ENCOUNTER — Ambulatory Visit (INDEPENDENT_AMBULATORY_CARE_PROVIDER_SITE_OTHER): Payer: BC Managed Care – PPO | Admitting: Internal Medicine

## 2012-02-14 ENCOUNTER — Ambulatory Visit (INDEPENDENT_AMBULATORY_CARE_PROVIDER_SITE_OTHER)
Admission: RE | Admit: 2012-02-14 | Discharge: 2012-02-14 | Disposition: A | Discharge status: Home / Self Care | Payer: BC Managed Care – PPO | Source: Ambulatory Visit | Attending: Internal Medicine | Admitting: Internal Medicine

## 2012-02-14 VITALS — BP 92/58 | HR 76 | Temp 97.2°F | Resp 14 | Wt 128.5 lb

## 2012-02-14 DIAGNOSIS — M545 Low back pain, unspecified: Secondary | ICD-10-CM

## 2012-02-14 DIAGNOSIS — M542 Cervicalgia: Secondary | ICD-10-CM

## 2012-02-14 MED ORDER — TIZANIDINE HCL 4 MG PO TABS: 4.0000 mg | ORAL_TABLET | Freq: Three times a day (TID) | ORAL | Status: AC | PRN

## 2012-02-14 MED ORDER — PREDNISONE (PAK) 10 MG PO TABS: 10.0000 mg | ORAL_TABLET | ORAL | Status: AC

## 2012-02-14 NOTE — Patient Instructions (Addendum)
It was good to see you today. Test(s) ordered today. Your results will be called to you after review (48-72hours after test completion). If any changes need to be made, you will be notified at that time. Pred pak for inflammation to treat acute pain flare as discussed - change methocarbamol to generic Zanaflex for muscle tightness as needed After done with pred pak, ok to use meloxcam as needed for pain Your prescription(s) have been submitted to your pharmacy. Please take as directed and contact our office if you believe you are having problem(s) with the medication(s). If pain symptoms worse, or if unimproved in next 2-4 weeks, call for refer to Nsurg in [place] as discussed
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
## 2012-02-14 NOTE — Assessment & Plan Note (Signed)
Acute flare 10d ago - not responding to conservative NSAIDs and muscle relaxants No neuro deficits on exam - no red flags on hx/exam Check plain film xray Change NSAIDs to pred taper x 12d Change muscle relaxer to zanaflex If unimproved or worse, pt will call for refer to Nsurg (sees Nsurg in [place])

## 2012-02-14 NOTE — Assessment & Plan Note (Signed)
Chronic pain - chronic myofascial spasm L shoulder blade region, no neuro deficits on exam Resume muscle relaxer with pred pak

## 2012-03-22 ENCOUNTER — Encounter: Payer: Self-pay | Admitting: Internal Medicine

## 2012-03-22 ENCOUNTER — Ambulatory Visit (INDEPENDENT_AMBULATORY_CARE_PROVIDER_SITE_OTHER): Payer: BC Managed Care – PPO | Admitting: Internal Medicine

## 2012-03-22 VITALS — BP 102/62 | HR 65 | Temp 97.4°F | Ht 62.0 in | Wt 130.4 lb

## 2012-03-22 DIAGNOSIS — M545 Low back pain, unspecified: Secondary | ICD-10-CM

## 2012-03-22 NOTE — Assessment & Plan Note (Signed)
Acute flare >6 weeks ago - not responding to conservative NSAIDs and muscle relaxants Briefly improved on pred taper No neuro deficits on exam - but will check MRI due to persisting severe pain continue zanaflex and meloxicam Consider PT vs back specialist eval for ?ESI or surgery depending on MRI results
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
## 2012-03-22 NOTE — Patient Instructions (Signed)
It was good to see you today. we'll make referral for MRI back. Our office will contact you regarding appointment(s) once made. Then your results will be called to you after review (48-72hours after test completion). If any changes need to be made, you will be notified at that time. continue generic Zanaflex for muscle tightness as needed, meloxcam as needed for pain and gabapentin 3x/day

## 2012-03-29 ENCOUNTER — Ambulatory Visit
Admission: RE | Admit: 2012-03-29 | Discharge: 2012-03-29 | Disposition: A | Discharge status: Home / Self Care | Payer: BC Managed Care – PPO | Source: Ambulatory Visit | Attending: Internal Medicine | Admitting: Internal Medicine

## 2012-03-29 DIAGNOSIS — M545 Low back pain, unspecified: Secondary | ICD-10-CM

## 2012-04-01 NOTE — Progress Notes (Signed)
Addended by: Rene Paci A on: 04/01/2012 07:01 AM   Modules accepted: Orders

## 2012-04-08 ENCOUNTER — Ambulatory Visit: Payer: BC Managed Care – PPO | Admitting: Internal Medicine

## 2012-08-10 ENCOUNTER — Other Ambulatory Visit: Payer: Self-pay | Admitting: Internal Medicine

## 2012-08-21 ENCOUNTER — Telehealth: Payer: Self-pay | Admitting: Internal Medicine

## 2012-08-21 MED ORDER — LEVOTHYROXINE SODIUM 75 MCG PO TABS: 75.0000 ug | ORAL_TABLET | Freq: Every day | ORAL | Status: DC

## 2012-08-21 NOTE — Telephone Encounter (Signed)
Rx corrected and resubmitted, pt advised of same.

## 2012-08-21 NOTE — Telephone Encounter (Signed)
Caller: Mckynzi/Patient; Patient Name: Sharon Stephenson; PCP: Rene Paci (Adults only); Best Callback Phone Number: [phone].  Patient calling about levothyroxine 75mg .  States her pharmacy did not check on the interval.  States last two rx told her to take 75mg  BID, and for many years she has taken 75 mg once daily.  States she does not recall discussing going up on the dose with Dr. Felicity Coyer.   Last TSH done January 2013 and at that time, Dr. Felicity Coyer told her to reduce her thyroid medication to once a day.  Per Epic, no notes seen related to dosing change except for labwork in January 2013.  Has appt scheduled in December 2013.  info to office for staff/provider review/Rx change/callback.   May reach patient at [phone].

## 2012-10-18 ENCOUNTER — Other Ambulatory Visit (INDEPENDENT_AMBULATORY_CARE_PROVIDER_SITE_OTHER): Payer: BC Managed Care – PPO

## 2012-10-18 ENCOUNTER — Ambulatory Visit (INDEPENDENT_AMBULATORY_CARE_PROVIDER_SITE_OTHER): Payer: BC Managed Care – PPO | Admitting: Internal Medicine

## 2012-10-18 ENCOUNTER — Encounter: Payer: Self-pay | Admitting: Internal Medicine

## 2012-10-18 VITALS — BP 100/70 | HR 70 | Temp 98.2°F | Ht 62.0 in | Wt 124.4 lb

## 2012-10-18 DIAGNOSIS — Z0000 Encounter for general adult medical examination without abnormal findings: Secondary | ICD-10-CM

## 2012-10-18 DIAGNOSIS — Z23 Encounter for immunization: Secondary | ICD-10-CM

## 2012-10-18 LAB — BASIC METABOLIC PANEL
BUN: 17 mg/dL (ref 6–23)
GFR: 115.93 mL/min (ref >60.00)
Glucose, Bld: 94 mg/dL (ref 70–99)
Potassium: 3.7 mEq/L (ref 3.5–5.1)

## 2012-10-18 LAB — LIPID PANEL
HDL: 67.1 mg/dL (ref >39.00)
LDL Cholesterol: 112 mg/dL — ABNORMAL HIGH (ref 0–99)
Total CHOL/HDL Ratio: 3
VLDL: 20 mg/dL (ref 0.0–40.0)

## 2012-10-18 LAB — URINALYSIS, ROUTINE W REFLEX MICROSCOPIC
Bilirubin Urine: NEGATIVE
Ketones, ur: NEGATIVE
Total Protein, Urine: NEGATIVE
pH: 6.5 (ref 5.0–8.0)

## 2012-10-18 LAB — CBC WITH DIFFERENTIAL/PLATELET
Basophils Relative: 0.6 % (ref 0.0–3.0)
Eosinophils Relative: 1.7 % (ref 0.0–5.0)
HCT: 36.1 % (ref 36.0–46.0)
Hemoglobin: 12.4 g/dL (ref 12.0–15.0)
MCV: 85.3 fl (ref 78.0–100.0)
Monocytes Absolute: 0.5 10*3/uL (ref 0.1–1.0)
Neutrophils Relative %: 40.6 % — ABNORMAL LOW (ref 43.0–77.0)
RBC: 4.23 Mil/uL (ref 3.87–5.11)
WBC: 7.3 10*3/uL (ref 4.5–10.5)

## 2012-10-18 LAB — HEPATIC FUNCTION PANEL: Total Bilirubin: 1.2 mg/dL (ref 0.3–1.2)

## 2012-10-18 NOTE — Progress Notes (Signed)
Subjective:    Patient ID: Sharon Stephenson, female    DOB: Jul 11, 1961, 51 y.o.   MRN: [id number]  HPI  patient is here today for annual physical. Patient feels well and has no complaints.  Past Medical History  Diagnosis Date  . ANEMIA, OTHER UNSPEC   . ANXIETY   . DEPRESSION   . HYPOTHYROIDISM   . Cervicalgia     chronic - follows with neuro in [place]  . Lumbar radicular syndrome spring 2013   Family History  Problem Relation Age of Onset  . Kidney disease Father   . Colon cancer Maternal Grandmother    History  Substance Use Topics  . Smoking status: Former Smoker    Quit date: 10/23/1985  . Smokeless tobacco: Not on file     Comment: Married, lives with spouse; no children. Originally from [place]  . Alcohol Use: Yes    Review of Systems  Constitutional: Negative for fever or weight change.  Respiratory: Negative for cough and shortness of breath.   Cardiovascular: Negative for chest pain or palpitations.  Gastrointestinal: Negative for abdominal pain, no bowel changes.  Musculoskeletal: Negative for gait problem or joint swelling.  Skin: Negative for rash.  Neurological: Negative for dizziness or headache.  No other specific complaints in a complete review of systems (except as listed in HPI above).     Objective:   Physical Exam  BP 100/70  Pulse 70  Temp 98.2 F (36.8 C) (Oral)  Ht 5\' 2"  (1.575 m)  Wt 124 lb 6.4 oz (56.427 kg)  BMI 22.75 kg/m2  SpO2 97% Wt Readings from Last 3 Encounters:  10/18/12 124 lb 6.4 oz (56.427 kg)  03/22/12 130 lb 6.4 oz (59.149 kg)  02/14/12 128 lb 8 oz (58.287 kg)   Constitutional: She appears well-developed and well-nourished. No distress.  HENT: Head: Normocephalic and atraumatic. Ears: B TMs ok, no erythema or effusion; Nose: Nose normal. Mouth/Throat: Oropharynx is clear and moist. No oropharyngeal exudate.  Eyes: Conjunctivae and EOM are normal. Pupils are equal, round, and reactive to light. No scleral icterus.  Neck:  Normal range of motion. Neck supple. No JVD present. No thyromegaly present.  Cardiovascular: Normal rate, regular rhythm and normal heart sounds.  No murmur heard. No BLE edema. Pulmonary/Chest: Effort normal and breath sounds normal. No respiratory distress. She has no wheezes.  Abdominal: Soft. Bowel sounds are normal. She exhibits no distension. There is no tenderness. no masses GU: defer to gyn (silva) Musculoskeletal: Normal range of motion, no joint effusions. No gross deformities Neurological: She is alert and oriented to person, place, and time. No cranial nerve deficit. Coordination normal.  Skin: Skin is warm and dry. No rash noted. No erythema.  Psychiatric: She has a normal mood and affect. Her behavior is normal. Judgment and thought content normal.    Lab Results  Component Value Date   WBC 5.3 08/09/2011   HGB 12.4 08/09/2011   HCT 36.0 08/09/2011   PLT 229.0 08/09/2011   GLUCOSE 84 08/09/2011   CHOL 192 08/09/2011   TRIG 102.0 08/09/2011   HDL 66.00 08/09/2011   LDLDIRECT 105.3 08/03/2010   LDLCALC 106* 08/09/2011   ALT 16 08/09/2011   AST 25 08/09/2011   NA 139 08/09/2011   K 3.6 08/09/2011   CL 105 08/09/2011   CREATININE 0.5 08/09/2011   BUN 19 08/09/2011   CO2 28 08/09/2011   TSH 0.09* 10/30/2011   EKG: NSR @67  bpm - no arrythmia or ischemic changes -  unchanged from 07/2011     Assessment & Plan:  CPX - v70.0 - Patient has been counseled on age-appropriate routine health concerns for screening and prevention. These are reviewed and up-to-date. Immunizations are up-to-date or declined. Labs and ECG reviewed.

## 2012-10-18 NOTE — Patient Instructions (Signed)
It was good to see you today. Health Maintenance reviewed - all recommended immunizations and age-appropriate screenings are up-to-date. Test(s) ordered today. Your results will be released to MyChart (or called to you) after review, usually within 72hours after test completion. If any changes need to be made, you will be notified at that same time. Medications reviewed and updated, no changes at this time. Please schedule followup in 12 months for annual medical exam and labs, call sooner if problems.  Health Maintenance, Females A healthy lifestyle and preventative care can promote health and wellness.  Maintain regular health, dental, and eye exams.   Eat a healthy diet. Foods like vegetables, fruits, whole grains, low-fat dairy products, and lean protein foods contain the nutrients you need without too many calories. Decrease your intake of foods high in solid fats, added sugars, and salt. Get information about a proper diet from your caregiver, if necessary.   Regular physical exercise is one of the most important things you can do for your health. Most adults should get at least 150 minutes of moderate-intensity exercise (any activity that increases your heart rate and causes you to sweat) each week. In addition, most adults need muscle-strengthening exercises on 2 or more days a week.     Maintain a healthy weight. The body mass index (BMI) is a screening tool to identify possible weight problems. It provides an estimate of body fat based on height and weight. Your caregiver can help determine your BMI, and can help you achieve or maintain a healthy weight. For adults 20 years and older:   A BMI below 18.5 is considered underweight.   A BMI of 18.5 to 24.9 is normal.   A BMI of 25 to 29.9 is considered overweight.   A BMI of 30 and above is considered obese.   Maintain normal blood lipids and cholesterol by exercising and minimizing your intake of saturated fat. Eat a balanced diet  with plenty of fruits and vegetables. Blood tests for lipids and cholesterol should begin at age 91 and be repeated every 5 years. If your lipid or cholesterol levels are high, you are over 50, or you are a high risk for heart disease, you may need your cholesterol levels checked more frequently. Ongoing high lipid and cholesterol levels should be treated with medicines if diet and exercise are not effective.   If you smoke, find out from your caregiver how to quit. If you do not use tobacco, do not start.   If you are pregnant, do not drink alcohol. If you are breastfeeding, be very cautious about drinking alcohol. If you are not pregnant and choose to drink alcohol, do not exceed 1 drink per day. One drink is considered to be 12 ounces (355 mL) of beer, 5 ounces (148 mL) of wine, or 1.5 ounces (44 mL) of liquor.   Avoid use of street drugs. Do not share needles with anyone. Ask for help if you need support or instructions about stopping the use of drugs.   High blood pressure causes heart disease and increases the risk of stroke. Blood pressure should be checked at least every 1 to 2 years. Ongoing high blood pressure should be treated with medicines, if weight loss and exercise are not effective.   If you are 26 to 52 years old, ask your caregiver if you should take aspirin to prevent strokes.   Diabetes screening involves taking a blood sample to check your fasting blood sugar level. This should be done  once every 3 years, after age 28, if you are within normal weight and without risk factors for diabetes. Testing should be considered at a younger age or be carried out more frequently if you are overweight and have at least 1 risk factor for diabetes.   Breast cancer screening is essential preventative care for women. You should practice "breast self-awareness." This means understanding the normal appearance and feel of your breasts and may include breast self-examination. Any changes detected, no  matter how small, should be reported to a caregiver. Women in their 56s and 30s should have a clinical breast exam (CBE) by a caregiver as part of a regular health exam every 1 to 3 years. After age 23, women should have a CBE every year. Starting at age 71, women should consider having a mammogram (breast X-ray) every year. Women who have a family history of breast cancer should talk to their caregiver about genetic screening. Women at a high risk of breast cancer should talk to their caregiver about having an MRI and a mammogram every year.   The Pap test is a screening test for cervical cancer. Women should have a Pap test starting at age 69. Between ages 55 and 83, Pap tests should be repeated every 2 years. Beginning at age 60, you should have a Pap test every 3 years as long as the past 3 Pap tests have been normal. If you had a hysterectomy for a problem that was not cancer or a condition that could lead to cancer, then you no longer need Pap tests. If you are between ages 75 and 34, and you have had normal Pap tests going back 10 years, you no longer need Pap tests. If you have had past treatment for cervical cancer or a condition that could lead to cancer, you need Pap tests and screening for cancer for at least 20 years after your treatment. If Pap tests have been discontinued, risk factors (such as a new sexual partner) need to be reassessed to determine if screening should be resumed. Some women have medical problems that increase the chance of getting cervical cancer. In these cases, your caregiver may recommend more frequent screening and Pap tests.   The human papillomavirus (HPV) test is an additional test that may be used for cervical cancer screening. The HPV test looks for the virus that can cause the cell changes on the cervix. The cells collected during the Pap test can be tested for HPV. The HPV test could be used to screen women aged 5 years and older, and should be used in women of any  age who have unclear Pap test results. After the age of 17, women should have HPV testing at the same frequency as a Pap test.   Colorectal cancer can be detected and often prevented. Most routine colorectal cancer screening begins at the age of 86 and continues through age 76. However, your caregiver may recommend screening at an earlier age if you have risk factors for colon cancer. On a yearly basis, your caregiver may provide home test kits to check for hidden blood in the stool. Use of a small camera at the end of a tube, to directly examine the colon (sigmoidoscopy or colonoscopy), can detect the earliest forms of colorectal cancer. Talk to your caregiver about this at age 68, when routine screening begins. Direct examination of the colon should be repeated every 5 to 10 years through age 12, unless early forms of pre-cancerous polyps or small  growths are found.   Hepatitis C blood testing is recommended for all people born from [birth year] through 1966 and any individual with known risks for hepatitis C.   Practice safe sex. Use condoms and avoid high-risk sexual practices to reduce the spread of sexually transmitted infections (STIs). Sexually active women aged 21 and younger should be checked for Chlamydia, which is a common sexually transmitted infection. Older women with new or multiple partners should also be tested for Chlamydia. Testing for other STIs is recommended if you are sexually active and at increased risk.   Osteoporosis is a disease in which the bones lose minerals and strength with aging. This can result in serious bone fractures. The risk of osteoporosis can be identified using a bone density scan. Women ages 89 and over and women at risk for fractures or osteoporosis should discuss screening with their caregivers. Ask your caregiver whether you should be taking a calcium supplement or vitamin D to reduce the rate of osteoporosis.   Menopause can be associated with physical symptoms and  risks. Hormone replacement therapy is available to decrease symptoms and risks. You should talk to your caregiver about whether hormone replacement therapy is right for you.   Use sunscreen with a sun protection factor (SPF) of 30 or greater. Apply sunscreen liberally and repeatedly throughout the day. You should seek shade when your shadow is shorter than you. Protect yourself by wearing long sleeves, pants, a wide-brimmed hat, and sunglasses year round, whenever you are outdoors.   Notify your caregiver of new moles or changes in moles, especially if there is a change in shape or color. Also notify your caregiver if a mole is larger than the size of a pencil eraser.   Stay current with your immunizations.  Document Released: 04/24/2011 Document Revised: 01/01/2012 Document Reviewed: 04/24/2011 [facility] Patient Information 2013 [place], [place].

## 2013-05-12 ENCOUNTER — Other Ambulatory Visit: Payer: Self-pay | Admitting: Internal Medicine

## 2013-07-28 LAB — HM MAMMOGRAPHY

## 2013-07-28 LAB — HM PAP SMEAR

## 2013-08-13 ENCOUNTER — Encounter: Payer: Self-pay | Admitting: Internal Medicine

## 2013-10-20 ENCOUNTER — Encounter: Payer: BC Managed Care – PPO | Admitting: Internal Medicine

## 2013-11-07 ENCOUNTER — Ambulatory Visit (INDEPENDENT_AMBULATORY_CARE_PROVIDER_SITE_OTHER): Payer: BC Managed Care – PPO | Admitting: Internal Medicine

## 2013-11-07 ENCOUNTER — Other Ambulatory Visit (INDEPENDENT_AMBULATORY_CARE_PROVIDER_SITE_OTHER): Payer: BC Managed Care – PPO

## 2013-11-07 ENCOUNTER — Encounter: Payer: Self-pay | Admitting: Internal Medicine

## 2013-11-07 VITALS — BP 100/72 | HR 70 | Temp 97.5°F | Wt 125.8 lb

## 2013-11-07 DIAGNOSIS — E039 Hypothyroidism, unspecified: Secondary | ICD-10-CM

## 2013-11-07 DIAGNOSIS — Z0000 Encounter for general adult medical examination without abnormal findings: Secondary | ICD-10-CM

## 2013-11-07 DIAGNOSIS — M545 Low back pain, unspecified: Secondary | ICD-10-CM

## 2013-11-07 DIAGNOSIS — IMO0002 Reserved for concepts with insufficient information to code with codable children: Secondary | ICD-10-CM

## 2013-11-07 DIAGNOSIS — M5416 Radiculopathy, lumbar region: Secondary | ICD-10-CM

## 2013-11-07 LAB — CBC WITH DIFFERENTIAL/PLATELET
BASOS ABS: 0 10*3/uL (ref 0.0–0.1)
Basophils Relative: 0.6 % (ref 0.0–3.0)
Eosinophils Absolute: 0.1 10*3/uL (ref 0.0–0.7)
Eosinophils Relative: 1.4 % (ref 0.0–5.0)
HCT: 33.9 % — ABNORMAL LOW (ref 36.0–46.0)
Hemoglobin: 11.6 g/dL — ABNORMAL LOW (ref 12.0–15.0)
Lymphocytes Relative: 40.9 % (ref 12.0–46.0)
Lymphs Abs: 2.2 10*3/uL (ref 0.7–4.0)
MCHC: 34.2 g/dL (ref 30.0–36.0)
MCV: 85.3 fl (ref 78.0–100.0)
MONO ABS: 0.3 10*3/uL (ref 0.1–1.0)
Monocytes Relative: 6 % (ref 3.0–12.0)
NEUTROS ABS: 2.7 10*3/uL (ref 1.4–7.7)
Neutrophils Relative %: 51.1 % (ref 43.0–77.0)
PLATELETS: 270 10*3/uL (ref 150.0–400.0)
RBC: 3.97 Mil/uL (ref 3.87–5.11)
RDW: 13.3 % (ref 11.5–14.6)
WBC: 5.3 10*3/uL (ref 4.5–10.5)

## 2013-11-07 LAB — LIPID PANEL
CHOL/HDL RATIO: 3
Cholesterol: 195 mg/dL (ref 0–200)
HDL: 65.5 mg/dL (ref >39.00)
LDL Cholesterol: 108 mg/dL — ABNORMAL HIGH (ref 0–99)
Triglycerides: 110 mg/dL (ref 0.0–149.0)
VLDL: 22 mg/dL (ref 0.0–40.0)

## 2013-11-07 LAB — BASIC METABOLIC PANEL
BUN: 14 mg/dL (ref 6–23)
CO2: 27 meq/L (ref 19–32)
Calcium: 9.1 mg/dL (ref 8.4–10.5)
Chloride: 105 mEq/L (ref 96–112)
Creatinine, Ser: 0.6 mg/dL (ref 0.4–1.2)
GFR: 115.46 mL/min (ref >60.00)
Glucose, Bld: 90 mg/dL (ref 70–99)
Potassium: 3.7 mEq/L (ref 3.5–5.1)
Sodium: 139 mEq/L (ref 135–145)

## 2013-11-07 LAB — HEPATIC FUNCTION PANEL
ALT: 15 U/L (ref 0–35)
AST: 19 U/L (ref 0–37)
Albumin: 4.1 g/dL (ref 3.5–5.2)
Alkaline Phosphatase: 55 U/L (ref 39–117)
BILIRUBIN DIRECT: 0.1 mg/dL (ref 0.0–0.3)
BILIRUBIN TOTAL: 1.9 mg/dL — AB (ref 0.3–1.2)
Total Protein: 7.2 g/dL (ref 6.0–8.3)

## 2013-11-07 LAB — URINALYSIS, ROUTINE W REFLEX MICROSCOPIC
Bilirubin Urine: NEGATIVE
HGB URINE DIPSTICK: NEGATIVE
Ketones, ur: NEGATIVE
LEUKOCYTES UA: NEGATIVE
Nitrite: NEGATIVE
RBC / HPF: NONE SEEN (ref 0–?)
Specific Gravity, Urine: 1.015 (ref 1.000–1.030)
TOTAL PROTEIN, URINE-UPE24: NEGATIVE
Urine Glucose: NEGATIVE
Urobilinogen, UA: 0.2 (ref 0.0–1.0)
pH: 7 (ref 5.0–8.0)

## 2013-11-07 LAB — TSH: TSH: 2.44 u[IU]/mL (ref 0.35–5.50)

## 2013-11-07 MED ORDER — TIZANIDINE HCL 4 MG PO CAPS: 4.0000 mg | ORAL_CAPSULE | Freq: Three times a day (TID) | ORAL | Status: DC | PRN

## 2013-11-07 MED ORDER — PREDNISONE (PAK) 10 MG PO TABS: ORAL_TABLET | ORAL | Status: DC

## 2013-11-07 NOTE — Assessment & Plan Note (Signed)
Acute flare  tx with Pred and muscle relaxants No neuro deficits on exam today refill zanaflex  Consider PT vs follow up [facility] back specialist eval for ?ESI depending on response to conservative tx

## 2013-11-07 NOTE — Patient Instructions (Addendum)
It was good to see you today.  We have reviewed your prior records including labs and tests today  Test(s) ordered today. Your results will be released to MyChart (or called to you) after review, usually within 72hours after test completion. If any changes need to be made, you will be notified at that same time.  continue generic Zanaflex for muscle tightness as needed, prednisone for next 6 days to decrease inflammation - Your prescription(s) have been submitted to your pharmacy. Please take as directed and contact our office if you believe you are having problem(s) with the medication(s).  Keep follow up as planned for your physical, call sooner if problems  Back Exercises Back exercises help treat and prevent back injuries. The goal is to increase your strength in your belly (abdominal) and back muscles. These exercises can also help with flexibility. Start these exercises when told by your doctor. HOME CARE Back exercises include: Pelvic Tilt.  Lie on your back with your knees bent. Tilt your pelvis until the lower part of your back is against the floor. Hold this position 5 to 10 sec. Repeat this exercise 5 to 10 times. Knee to Chest.  Pull 1 knee up against your chest and hold for 20 to 30 seconds. Repeat this with the other knee. This may be done with the other leg straight or bent, whichever feels better. Then, pull both knees up against your chest. Sit-Ups or Curl-Ups.  Bend your knees 90 degrees. Start with tilting your pelvis, and do a partial, slow sit-up. Only lift your upper half 30 to 45 degrees off the floor. Take at least 2 to 3 seonds for each sit-up. Do not do sit-ups with your knees out straight. If partial sit-ups are difficult, simply do the above but with only tightening your belly (abdominal) muscles and holding it as told. Hip-Lift.  Lie on your back with your knees flexed 90 degrees. Push down with your feet and shoulders as you raise your hips 2 inches off the  floor. Hold for 10 seconds, repeat 5 to 10 times. Back Arches.  Lie on your stomach. Prop yourself up on bent elbows. Slowly press on your hands, causing an arch in your low back. Repeat 3 to 5 times. Shoulder-Lifts.  Lie face down with arms beside your body. Keep hips and belly pressed to floor as you slowly lift your head and shoulders off the floor. Do not overdo your exercises. Be careful in the beginning. Exercises may cause you some mild back discomfort. If the pain lasts for more than 15 minutes, stop the exercises until you see your doctor. Improvement with exercise for back problems is slow.  Document Released: 11/11/2010 Document Revised: 01/01/2012 Document Reviewed: 08/10/2011 [facility] Patient Information 2014 [place]

## 2013-11-07 NOTE — Progress Notes (Signed)
Pre-visit discussion using our clinic review tool. No additional management support is needed unless otherwise documented below in the visit note.  

## 2013-11-07 NOTE — Progress Notes (Signed)
   Subjective:    Patient ID: Sharon Stephenson, female    DOB: 10/22/1961, 53 y.o.   MRN: [id number]  HPI   complains of R>L LE numbness in foot Associated with increasing but mild low back pain No fall or injury Symptoms relieved with lying supine but exacerbated by sitting upright, standing, walking or other activity Symptoms improved with muscle relaxer Denies association with pain or weakness  Past Medical History  Diagnosis Date  . ANEMIA, OTHER UNSPEC   . ANXIETY   . DEPRESSION   . HYPOTHYROIDISM   . Cervicalgia     chronic - follows with neuro in [place]  . Lumbar radicular syndrome spring 2013    Review of Systems  Constitutional: Negative for fatigue and unexpected weight change.  Respiratory: Negative for cough, shortness of breath and wheezing.   Cardiovascular: Negative for chest pain, palpitations and leg swelling.  Gastrointestinal: Negative for nausea, abdominal pain and diarrhea.  Musculoskeletal: Positive for back pain (intermittent, chronic sx). Negative for gait problem and myalgias.  Neurological: Negative for dizziness, weakness, light-headedness and headaches.  Psychiatric/Behavioral: Negative for dysphoric mood. The patient is not nervous/anxious.   All other systems reviewed and are negative.       Objective:   Physical Exam BP 100/72  Pulse 70  Temp(Src) 97.5 F (36.4 C) (Oral)  Wt 125 lb 12.8 oz (57.063 kg)  SpO2 97% Wt Readings from Last 3 Encounters:  11/07/13 125 lb 12.8 oz (57.063 kg)  10/18/12 124 lb 6.4 oz (56.427 kg)  03/22/12 130 lb 6.4 oz (59.149 kg)   Constitutional: She appears well-developed and well-nourished. No distress. spouse at side Cardiovascular: Normal rate, regular rhythm and normal heart sounds.  No murmur heard. No BLE edema. Pulmonary/Chest: Effort normal and breath sounds normal. No respiratory distress. She has no wheezes.  Musculoskeletal: Back: full range of motion of thoracic and lumbar spine. Non tender to palpation.  Negative straight leg raise. DTR's are symmetrically intact. Sensation intact in all dermatomes of the lower extremities. Full strength to manual muscle testing. patient is able to heel toe walk without difficulty and ambulates with antalgic gait. Normal range of motion, no joint effusions. No gross deformities Neurological: She is alert and oriented to person, place, and time. No cranial nerve deficit. Coordination, balance, strength, speech and gait are normal.  Skin: Skin is warm and dry. No rash noted. No erythema.  Psychiatric: She has a normal mood and affect. Her behavior is normal. Judgment and thought content normal.   Lab Results  Component Value Date   WBC 7.3 10/18/2012   HGB 12.4 10/18/2012   HCT 36.1 10/18/2012   PLT 305.0 10/18/2012   GLUCOSE 94 10/18/2012   CHOL 199 10/18/2012   TRIG 100.0 10/18/2012   HDL 67.10 10/18/2012   LDLDIRECT 105.3 08/03/2010   LDLCALC 112* 10/18/2012   ALT 16 10/18/2012   AST 21 10/18/2012   NA 138 10/18/2012   K 3.7 10/18/2012   CL 102 10/18/2012   CREATININE 0.6 10/18/2012   BUN 17 10/18/2012   CO2 28 10/18/2012   TSH 2.82 10/18/2012       Assessment & Plan:   Numbness R>L foot, intermittent -Radicular symptoms - 02/2012 MRI L spine reviewed - no neuro deficits on motor exam today- symptoms improved (less numbness) with lying supine and Zanaflex  Treat with prednisone taper and continue muscle relaxant Home back exercises provided Patient will follow up with spine specialist if unimproved or worsening symptoms

## 2013-11-11 ENCOUNTER — Other Ambulatory Visit: Payer: Self-pay | Admitting: Internal Medicine

## 2013-11-27 ENCOUNTER — Ambulatory Visit (INDEPENDENT_AMBULATORY_CARE_PROVIDER_SITE_OTHER): Payer: BC Managed Care – PPO | Admitting: Internal Medicine

## 2013-11-27 ENCOUNTER — Encounter: Payer: Self-pay | Admitting: Internal Medicine

## 2013-11-27 VITALS — BP 110/72 | HR 70 | Temp 97.5°F | Ht 62.0 in | Wt 124.0 lb

## 2013-11-27 DIAGNOSIS — M899 Disorder of bone, unspecified: Secondary | ICD-10-CM

## 2013-11-27 DIAGNOSIS — Z23 Encounter for immunization: Secondary | ICD-10-CM

## 2013-11-27 DIAGNOSIS — Z0000 Encounter for general adult medical examination without abnormal findings: Secondary | ICD-10-CM

## 2013-11-27 DIAGNOSIS — M8580 Other specified disorders of bone density and structure, unspecified site: Secondary | ICD-10-CM

## 2013-11-27 DIAGNOSIS — E039 Hypothyroidism, unspecified: Secondary | ICD-10-CM

## 2013-11-27 DIAGNOSIS — M949 Disorder of cartilage, unspecified: Secondary | ICD-10-CM

## 2013-11-27 NOTE — Progress Notes (Signed)
Sharon Stephenson [id number] 11/27/2013   Chief Complaint  Patient presents with  . Annual Exam    Subjective  HPI Patient is here for her annual physical.  Reviewed chronic medical condition and intervening medical events.  Patient has new complaints.  Past Medical History  Diagnosis Date  . ANEMIA, OTHER UNSPEC   . ANXIETY   . DEPRESSION   . HYPOTHYROIDISM   . Cervicalgia     chronic - follows with neuro in [place]  . Lumbar radicular syndrome spring 2013    Past Surgical History  Procedure Laterality Date  . Tonsillectomy    . Breast surgery      Breast reduction    Family History  Problem Relation Age of Onset  . Kidney disease Father   . Colon cancer Maternal Grandmother     History  Substance Use Topics  . Smoking status: Former Smoker    Quit date: 10/23/1985  . Smokeless tobacco: Not on file     Comment: Married, lives with spouse; no children. [place]  . Alcohol Use: Yes    Current Outpatient Prescriptions on File Prior to Visit  Medication Sig Dispense Refill  . BEPREVE 1.5 % SOLN 1 drop as needed. Allergy seasons      . calcium gluconate 500 MG tablet Take 500 mg by mouth daily.        . Cholecalciferol (VITAMIN D3) 1000 UNITS CAPS Take by mouth daily.        . Coenzyme Q10 (COQ10) 100 MG CAPS Take by mouth daily.        . Evening Primrose Oil 500 MG CAPS Take 1 capsule by mouth daily.      Marland Kitchen. gabapentin (NEURONTIN) 300 MG capsule Take 300 mg by mouth daily as needed.        Marland Kitchen. GARCINIA CAMBOGIA-CHROMIUM PO Take by mouth daily.      . Glucosamine-Chondroitin-Vit D3 1500-1200-800 MG-MG-UNIT PACK Take by mouth daily.        . Iron Carbonyl-Vitamin C-FOS (CHEWABLE IRON PO) Take by mouth every other day.      . levothyroxine (SYNTHROID, LEVOTHROID) 75 MCG tablet TAKE ONE TABLET BY MOUTH ONE TIME DAILY   30 tablet  11  . Magnesium 200 MG TABS Take by mouth daily.        . Melatonin 1 MG TABS Take 0.5 mg by mouth at bedtime as needed.       .  Multiple Vitamin (MULTIVITAMIN) tablet Take 1 tablet by mouth daily.        . NON FORMULARY Curanmin herbal supplement ovc as needed      . NON FORMULARY Arthocin herbal supplement ovc as needed      . NON FORMULARY Holy Basil Leaf supplement ovc take once a day      . NON FORMULARY S-HTP 100 mg ovc supplement take once a day      . Omega-3 Fatty Acids (FISH OIL) 1000 MG CAPS Take by mouth daily.        . predniSONE (STERAPRED UNI-PAK) 10 MG tablet Take by mouth as directed. As directed x 6 days  21 tablet  0  . Probiotic Product (PROBIOTIC FORMULA) CAPS Take by mouth daily.        Marland Kitchen. tiZANidine (ZANAFLEX) 4 MG capsule Take 1 capsule (4 mg total) by mouth 3 (three) times daily as needed for muscle spasms.  30 capsule  0  . Turmeric 500 MG CAPS Take by mouth daily.  No current facility-administered medications on file prior to visit.    Allergies:  Allergies  Allergen Reactions  . Nitrofurantoin   . Penicillins     Review of Systems  Constitutional: Negative for fever, chills, weight loss and malaise/fatigue.  HENT: Negative for congestion and nosebleeds.   Eyes: Negative for blurred vision.  Respiratory: Negative for cough, shortness of breath and wheezing.   Cardiovascular: Negative for chest pain.  Gastrointestinal: Negative for heartburn, nausea and vomiting.  Musculoskeletal: Positive for back pain (chronic- has improved with PT).  Neurological: Negative for speech change and headaches.  Psychiatric/Behavioral: Negative for depression and suicidal ideas. The patient is not nervous/anxious and does not have insomnia.        Objective  Filed Vitals:   11/27/13 0814  BP: 110/72  Pulse: 70  Temp: 97.5 F (36.4 C)  TempSrc: Oral  Height: 5\' 2"  (1.575 m)  Weight: 124 lb (56.246 kg)  SpO2: 97%    Physical Exam  Nursing note and vitals reviewed. Constitutional: She is oriented to person, place, and time. She appears well-developed and well-nourished. No distress.    HENT:  Head: Normocephalic and atraumatic.  Right Ear: External ear normal.  Left Ear: External ear normal.  Eyes: Pupils are equal, round, and reactive to light.  Neck: No JVD present.  Cardiovascular: Normal rate, regular rhythm, normal heart sounds and intact distal pulses.  Exam reveals no gallop and no friction rub.   No murmur heard. Respiratory: Effort normal and breath sounds normal. No stridor. No respiratory distress. She has no wheezes. She has no rales. She exhibits no tenderness.  GI: Soft. Bowel sounds are normal. She exhibits no distension. There is no tenderness.  Musculoskeletal: Normal range of motion. She exhibits no tenderness.  Lymphadenopathy:    She has no cervical adenopathy.  Neurological: She is alert and oriented to person, place, and time.  Skin: She is not diaphoretic.  Psychiatric: She has a normal mood and affect. Her speech is normal and behavior is normal. Judgment and thought content normal. Cognition and memory are normal.    BP Readings from Last 3 Encounters:  11/27/13 110/72  11/07/13 100/72  10/18/12 100/70    Wt Readings from Last 3 Encounters:  11/27/13 124 lb (56.246 kg)  11/07/13 125 lb 12.8 oz (57.063 kg)  10/18/12 124 lb 6.4 oz (56.427 kg)    Lab Results  Component Value Date   WBC 5.3 11/07/2013   HGB 11.6* 11/07/2013   HCT 33.9* 11/07/2013   PLT 270.0 11/07/2013   GLUCOSE 90 11/07/2013   CHOL 195 11/07/2013   TRIG 110.0 11/07/2013   HDL 65.50 11/07/2013   LDLDIRECT 105.3 08/03/2010   LDLCALC 108* 11/07/2013   ALT 15 11/07/2013   AST 19 11/07/2013   NA 139 11/07/2013   K 3.7 11/07/2013   CL 105 11/07/2013   CREATININE 0.6 11/07/2013   BUN 14 11/07/2013   CO2 27 11/07/2013   TSH 2.44 11/07/2013    Mr Lumbar Spine Wo Contrast  03/29/2012   *RADIOLOGY REPORT*  Clinical Data: Low back pain.  MRI LUMBAR SPINE WITHOUT CONTRAST  Technique:  Multiplanar and multiecho pulse sequences of the lumbar spine were obtained without intravenous  contrast.  Comparison: Plain films lumbar spine 02/14/2012.  Findings: Vertebral body height and alignment are maintained.  A few small hemangiomas are noted.  No worrisome marrow lesion. Conus medullaris is normal in signal and position.  Imaged intra- abdominal contents are unremarkable.  The T11-12  level is imaged in the sagittal plane only and normal.  T12-1:  Negative.  L1-2:  Negative.  L2-3:  Minimal disc bulge.  Central canal and foramina widely patent.  L3-4: Mild disc bulge with a small left foraminal protrusion.  The disc just contacts the exiting left L3 root.  Central canal and right foramen open.  L4-5:  The patient has a mild disc bulge with some ligamentum flavum thickening and early facet degenerative change.  There is a focal left lateral recess protrusion which could impact the descending left L5 root.  Central canal and left foramen are mildly narrowed.  Right foramen open.  L5-S1:  Negative.  IMPRESSION:  1.  Disc bulge with a small left lateral recess protrusion at L4-5 causes mild central canal narrowing.  The protrusion could impact the descending left L5 root. 2.  Small left foraminal protrusion at L3-4 just contacts the exiting left L3 root.  Original Report Authenticated By: Bernadene Bell. Maricela Curet, M.D.    Assessment and Plan  V70.0 - Patients states proper diet and exercise,  No new family changes or deaths.  Patient reports Mammogram and Pap smear done in October 2014 with normal results for both.  Patient will received Tdap and will be scheduled for DXA scan,  Colonoscopy was performed 08/31/2009  Hypothyroidism - labs reviewed on 11/07/2013 The current medical regimen is effective;  continue present plan and medications.  Sciatica -  Chronic- was treated by PT for same.  reviewed MRI on 03/29/1012 for same. Patients reports improvement in symptoms with PT and medication.  The current medical regimen is effective;  continue present plan and medications.  No labs needed for today.    Patient was instructed to notify the office if any new symptoms emerged or the current symptoms become worse.  Return in about 1 year (around 11/27/2014) for annual exam and labs.  Rene Paci, MD    I have personally reviewed this case with PA student. I also personally examined this patient. I agree with history and findings as documented above. I reviewed, discussed and approve of the assessment and plan as listed above. Rene Paci, MD

## 2013-11-27 NOTE — Progress Notes (Signed)
Pre-visit discussion using our clinic review tool. No additional management support is needed unless otherwise documented below in the visit note.  

## 2013-11-27 NOTE — Progress Notes (Signed)
Subjective:    Patient ID: Sharon Stephenson, female    DOB: 08/18/1961, 53 y.o.   MRN: [id number]  HPI patient is here today for annual physical. Patient feels well overall.  Also reviewed chronic medical issues and interval medical events  Past Medical History  Diagnosis Date  . ANEMIA, OTHER UNSPEC   . ANXIETY   . DEPRESSION   . HYPOTHYROIDISM   . Cervicalgia     chronic - follows with neuro in [place]  . Lumbar radicular syndrome spring 2013   Family History  Problem Relation Age of Onset  . Kidney disease Father   . Colon cancer Maternal Grandmother    History  Substance Use Topics  . Smoking status: Former Smoker    Quit date: 10/23/1985  . Smokeless tobacco: Not on file     Comment: Married, lives with spouse; no children. Originally from [place]  . Alcohol Use: Yes    Review of Systems  Constitutional: Negative for fatigue and unexpected weight change.  Respiratory: Negative for cough, shortness of breath and wheezing.   Cardiovascular: Negative for chest pain, palpitations and leg swelling.  Gastrointestinal: Negative for nausea, abdominal pain and diarrhea.  Neurological: Negative for dizziness, weakness, light-headedness and headaches.  Psychiatric/Behavioral: Negative for dysphoric mood. The patient is not nervous/anxious.   All other systems reviewed and are negative.        Objective:   Physical Exam BP 110/72  Pulse 70  Temp(Src) 97.5 F (36.4 C) (Oral)  Ht 5\' 2"  (1.575 m)  Wt 124 lb (56.246 kg)  BMI 22.67 kg/m2  SpO2 97% Wt Readings from Last 3 Encounters:  11/27/13 124 lb (56.246 kg)  11/07/13 125 lb 12.8 oz (57.063 kg)  10/18/12 124 lb 6.4 oz (56.427 kg)   Constitutional: She appears well-developed and well-nourished. No distress.  HENT: Head: Normocephalic and atraumatic. Ears: B TMs ok, no erythema or effusion; Nose: Nose normal. Mouth/Throat: Oropharynx is clear and moist. No oropharyngeal exudate.  Eyes: Conjunctivae and EOM are normal. Pupils  are equal, round, and reactive to light. No scleral icterus.  Neck: Normal range of motion. Neck supple. No JVD present. No thyromegaly present.  Cardiovascular: Normal rate, regular rhythm and normal heart sounds.  No murmur heard. No BLE edema. Pulmonary/Chest: Effort normal and breath sounds normal. No respiratory distress. She has no wheezes.  Abdominal: Soft. Bowel sounds are normal. She exhibits no distension. There is no tenderness. no masses GU: defer to gyn (silva) Musculoskeletal: Normal range of motion, no joint effusions. No gross deformities Neurological: She is alert and oriented to person, place, and time. No cranial nerve deficit. Coordination normal.  Skin: Skin is warm and dry. No rash noted. No erythema.  Psychiatric: She has a normal mood and affect. Her behavior is normal. Judgment and thought content normal.    Lab Results  Component Value Date   WBC 5.3 11/07/2013   HGB 11.6* 11/07/2013   HCT 33.9* 11/07/2013   PLT 270.0 11/07/2013   GLUCOSE 90 11/07/2013   CHOL 195 11/07/2013   TRIG 110.0 11/07/2013   HDL 65.50 11/07/2013   LDLDIRECT 105.3 08/03/2010   LDLCALC 108* 11/07/2013   ALT 15 11/07/2013   AST 19 11/07/2013   NA 139 11/07/2013   K 3.7 11/07/2013   CL 105 11/07/2013   CREATININE 0.6 11/07/2013   BUN 14 11/07/2013   CO2 27 11/07/2013   TSH 2.44 11/07/2013       Assessment & Plan:  CPX - v70.0 -  Patient has been counseled on age-appropriate routine health concerns for screening and prevention. These are reviewed and up-to-date. Immunizations are up-to-date or declined. Labs and prior ECG reviewed.  Schedule DEXA Also See problem list. Medications and labs reviewed today.

## 2013-11-27 NOTE — Assessment & Plan Note (Signed)
On hormone replacement for same Check annually and as needed, adjust as needed   Lab Results  Component Value Date   TSH 2.44 11/07/2013

## 2013-11-27 NOTE — Patient Instructions (Addendum)
It was good to see you today.  We have reviewed your prior records including labs and tests today  Health Maintenance reviewed - all recommended immunizations and age-appropriate screenings are up-to-date. Tdap (tetanus diphtheria and pertussis) updated today (every 10 years)  we'll schedule your bone density scan. Our office will contact you regarding appointment(s) once made. Your results will be released to [place] (or called to you) after review, usually within 72hours after test completion. If any changes need to be made, you will be notified at that same time.  Medications reviewed and updated, no changes recommended at this time.  Please schedule followup in 12 months for annual exam and labs, call sooner if problems.  Health Maintenance, Female A healthy lifestyle and preventative care can promote health and wellness.  Maintain regular health, dental, and eye exams.  Eat a healthy diet. Foods like vegetables, fruits, whole grains, low-fat dairy products, and lean protein foods contain the nutrients you need without too many calories. Decrease your intake of foods high in solid fats, added sugars, and salt. Get information about a proper diet from your caregiver, if necessary.  Regular physical exercise is one of the most important things you can do for your health. Most adults should get at least 150 minutes of moderate-intensity exercise (any activity that increases your heart rate and causes you to sweat) each week. In addition, most adults need muscle-strengthening exercises on 2 or more days a week.   Maintain a healthy weight. The body mass index (BMI) is a screening tool to identify possible weight problems. It provides an estimate of body fat based on height and weight. Your caregiver can help determine your BMI, and can help you achieve or maintain a healthy weight. For adults 20 years and older:  A BMI below 18.5 is considered underweight.  A BMI of 18.5 to 24.9 is  normal.  A BMI of 25 to 29.9 is considered overweight.  A BMI of 30 and above is considered obese.  Maintain normal blood lipids and cholesterol by exercising and minimizing your intake of saturated fat. Eat a balanced diet with plenty of fruits and vegetables. Blood tests for lipids and cholesterol should begin at age 17 and be repeated every 5 years. If your lipid or cholesterol levels are high, you are over 50, or you are a high risk for heart disease, you may need your cholesterol levels checked more frequently.Ongoing high lipid and cholesterol levels should be treated with medicines if diet and exercise are not effective.  If you smoke, find out from your caregiver how to quit. If you do not use tobacco, do not start.  Lung cancer screening is recommended for adults aged 71 80 years who are at high risk for developing lung cancer because of a history of smoking. Yearly low-dose computed tomography (CT) is recommended for people who have at least a 30-pack-year history of smoking and are a current smoker or have quit within the past 15 years. A pack year of smoking is smoking an average of 1 pack of cigarettes a day for 1 year (for example: 1 pack a day for 30 years or 2 packs a day for 15 years). Yearly screening should continue until the smoker has stopped smoking for at least 15 years. Yearly screening should also be stopped for people who develop a health problem that would prevent them from having lung cancer treatment.  If you are pregnant, do not drink alcohol. If you are breastfeeding, be very cautious  about drinking alcohol. If you are not pregnant and choose to drink alcohol, do not exceed 1 drink per day. One drink is considered to be 12 ounces (355 mL) of beer, 5 ounces (148 mL) of wine, or 1.5 ounces (44 mL) of liquor.  Avoid use of street drugs. Do not share needles with anyone. Ask for help if you need support or instructions about stopping the use of drugs.  High blood pressure  causes heart disease and increases the risk of stroke. Blood pressure should be checked at least every 1 to 2 years. Ongoing high blood pressure should be treated with medicines, if weight loss and exercise are not effective.  If you are 78 to 54 years old, ask your caregiver if you should take aspirin to prevent strokes.  Diabetes screening involves taking a blood sample to check your fasting blood sugar level. This should be done once every 3 years, after age 43, if you are within normal weight and without risk factors for diabetes. Testing should be considered at a younger age or be carried out more frequently if you are overweight and have at least 1 risk factor for diabetes.  Breast cancer screening is essential preventative care for women. You should practice "breast self-awareness." This means understanding the normal appearance and feel of your breasts and may include breast self-examination. Any changes detected, no matter how small, should be reported to a caregiver. Women in their 31s and 30s should have a clinical breast exam (CBE) by a caregiver as part of a regular health exam every 1 to 3 years. After age 38, women should have a CBE every year. Starting at age 87, women should consider having a mammogram (breast X-ray) every year. Women who have a family history of breast cancer should talk to their caregiver about genetic screening. Women at a high risk of breast cancer should talk to their caregiver about having an MRI and a mammogram every year.  Breast cancer gene (BRCA)-related cancer risk assessment is recommended for women who have family members with BRCA-related cancers. BRCA-related cancers include breast, ovarian, tubal, and peritoneal cancers. Having family members with these cancers may be associated with an increased risk for harmful changes (mutations) in the breast cancer genes BRCA1 and BRCA2. Results of the assessment will determine the need for genetic counseling and BRCA1  and BRCA2 testing.  The Pap test is a screening test for cervical cancer. Women should have a Pap test starting at age 67. Between ages 60 and 23, Pap tests should be repeated every 2 years. Beginning at age 31, you should have a Pap test every 3 years as long as the past 3 Pap tests have been normal. If you had a hysterectomy for a problem that was not cancer or a condition that could lead to cancer, then you no longer need Pap tests. If you are between ages 65 and 64, and you have had normal Pap tests going back 10 years, you no longer need Pap tests. If you have had past treatment for cervical cancer or a condition that could lead to cancer, you need Pap tests and screening for cancer for at least 20 years after your treatment. If Pap tests have been discontinued, risk factors (such as a new sexual partner) need to be reassessed to determine if screening should be resumed. Some women have medical problems that increase the chance of getting cervical cancer. In these cases, your caregiver may recommend more frequent screening and Pap tests.  The human papillomavirus (HPV) test is an additional test that may be used for cervical cancer screening. The HPV test looks for the virus that can cause the cell changes on the cervix. The cells collected during the Pap test can be tested for HPV. The HPV test could be used to screen women aged 54 years and older, and should be used in women of any age who have unclear Pap test results. After the age of 9, women should have HPV testing at the same frequency as a Pap test.  Colorectal cancer can be detected and often prevented. Most routine colorectal cancer screening begins at the age of 8 and continues through age 2. However, your caregiver may recommend screening at an earlier age if you have risk factors for colon cancer. On a yearly basis, your caregiver may provide home test kits to check for hidden blood in the stool. Use of a small camera at the end of a tube,  to directly examine the colon (sigmoidoscopy or colonoscopy), can detect the earliest forms of colorectal cancer. Talk to your caregiver about this at age 74, when routine screening begins. Direct examination of the colon should be repeated every 5 to 10 years through age 50, unless early forms of pre-cancerous polyps or small growths are found.  Hepatitis C blood testing is recommended for all people born from [birth year] through 1966 and any individual with known risks for hepatitis C.  Practice safe sex. Use condoms and avoid high-risk sexual practices to reduce the spread of sexually transmitted infections (STIs). Sexually active women aged 30 and younger should be checked for Chlamydia, which is a common sexually transmitted infection. Older women with new or multiple partners should also be tested for Chlamydia. Testing for other STIs is recommended if you are sexually active and at increased risk.  Osteoporosis is a disease in which the bones lose minerals and strength with aging. This can result in serious bone fractures. The risk of osteoporosis can be identified using a bone density scan. Women ages 52 and over and women at risk for fractures or osteoporosis should discuss screening with their caregivers. Ask your caregiver whether you should be taking a calcium supplement or vitamin D to reduce the rate of osteoporosis.  Menopause can be associated with physical symptoms and risks. Hormone replacement therapy is available to decrease symptoms and risks. You should talk to your caregiver about whether hormone replacement therapy is right for you.  Use sunscreen. Apply sunscreen liberally and repeatedly throughout the day. You should seek shade when your shadow is shorter than you. Protect yourself by wearing long sleeves, pants, a wide-brimmed hat, and sunglasses year round, whenever you are outdoors.  Notify your caregiver of new moles or changes in moles, especially if there is a change in shape or  color. Also notify your caregiver if a mole is larger than the size of a pencil eraser.  Stay current with your immunizations. Document Released: 04/24/2011 Document Revised: 02/03/2013 Document Reviewed: 04/24/2011 [facility] Patient Information 2014 [place].

## 2013-12-25 ENCOUNTER — Other Ambulatory Visit: Payer: BC Managed Care – PPO

## 2013-12-31 ENCOUNTER — Ambulatory Visit (INDEPENDENT_AMBULATORY_CARE_PROVIDER_SITE_OTHER)
Admission: RE | Admit: 2013-12-31 | Discharge: 2013-12-31 | Disposition: A | Discharge status: Home / Self Care | Payer: BC Managed Care – PPO | Source: Ambulatory Visit | Attending: Internal Medicine | Admitting: Internal Medicine

## 2013-12-31 DIAGNOSIS — M899 Disorder of bone, unspecified: Secondary | ICD-10-CM

## 2013-12-31 DIAGNOSIS — M949 Disorder of cartilage, unspecified: Secondary | ICD-10-CM

## 2013-12-31 DIAGNOSIS — M8580 Other specified disorders of bone density and structure, unspecified site: Secondary | ICD-10-CM

## 2014-01-05 ENCOUNTER — Encounter: Payer: Self-pay | Admitting: Internal Medicine

## 2014-01-05 DIAGNOSIS — M8580 Other specified disorders of bone density and structure, unspecified site: Secondary | ICD-10-CM

## 2014-01-05 HISTORY — DX: Other specified disorders of bone density and structure, unspecified site: M85.80

## 2014-03-31 ENCOUNTER — Telehealth: Payer: Self-pay | Admitting: *Deleted

## 2014-03-31 DIAGNOSIS — R202 Paresthesia of skin: Principal | ICD-10-CM

## 2014-03-31 DIAGNOSIS — R200 Anesthesia of skin: Secondary | ICD-10-CM

## 2014-03-31 NOTE — Telephone Encounter (Signed)
Left msg on vm stating needing a updated referral to see Dr. Vallery Ridge neurologist in [facility]. Has been seeing him for 12 years, but since she has a new problem they are asking for a referral. Pt states she been having tingling and numbness in her (R) foot...Sharon Stephenson

## 2014-04-01 NOTE — Telephone Encounter (Signed)
Notified pt md ok referral will be contacted with appt once [facility] get schedule...Raechel Chute

## 2014-04-01 NOTE — Telephone Encounter (Signed)
Ok order done 

## 2014-05-11 ENCOUNTER — Encounter: Payer: Self-pay | Admitting: Internal Medicine

## 2014-06-18 ENCOUNTER — Encounter: Payer: Self-pay | Admitting: Internal Medicine

## 2014-06-18 ENCOUNTER — Ambulatory Visit (INDEPENDENT_AMBULATORY_CARE_PROVIDER_SITE_OTHER): Payer: BC Managed Care – PPO | Admitting: Internal Medicine

## 2014-06-18 ENCOUNTER — Other Ambulatory Visit (INDEPENDENT_AMBULATORY_CARE_PROVIDER_SITE_OTHER): Payer: BC Managed Care – PPO

## 2014-06-18 VITALS — BP 108/72 | HR 71 | Temp 98.0°F | Ht 62.0 in | Wt 125.2 lb

## 2014-06-18 DIAGNOSIS — E039 Hypothyroidism, unspecified: Secondary | ICD-10-CM

## 2014-06-18 DIAGNOSIS — M542 Cervicalgia: Secondary | ICD-10-CM

## 2014-06-18 DIAGNOSIS — K1120 Sialoadenitis, unspecified: Secondary | ICD-10-CM

## 2014-06-18 LAB — TSH: TSH: 3.14 u[IU]/mL (ref 0.35–4.50)

## 2014-06-18 MED ORDER — METHOCARBAMOL 500 MG PO TABS: 500.0000 mg | ORAL_TABLET | Freq: Three times a day (TID) | ORAL | Status: DC | PRN

## 2014-06-18 NOTE — Progress Notes (Signed)
Subjective:    Patient ID: Sharon Stephenson, female    DOB: 07/16/1961, 53 y.o.   MRN: [id number]  HPI  Patient is here for follow up  Reviewed chronic medical issues and interval medical events  Past Medical History  Diagnosis Date  . ANEMIA, OTHER UNSPEC   . ANXIETY   . DEPRESSION   . HYPOTHYROIDISM   . Cervicalgia     chronic - follows with neuro in [place]  . Lumbar radicular syndrome spring 2013  . Osteopenia 01/05/2014    DEXA @ [facility] 12/2013: -1.2    Review of Systems  Constitutional: Negative for fever and fatigue.  HENT: Positive for facial swelling (L jaw but improved x 9 d) and postnasal drip. Negative for dental problem, drooling, ear discharge, ear pain, sinus pressure, sore throat, tinnitus, trouble swallowing and voice change.   Respiratory: Negative for cough and shortness of breath.   Musculoskeletal: Positive for arthralgias, myalgias and neck stiffness (chronic - ?refill robaxin).  Skin: Negative for rash and wound.       Swelling over L jaw angle 9 days ago, improved since onset, nearly resolved at this time - initially uncomfortable with meals, now painless       Objective:   Physical Exam  BP 108/72  Pulse 71  Temp(Src) 98 F (36.7 C) (Oral)  Ht  (1.575 m)  Wt 125 lb 4 oz (56.813 kg)  BMI 22.90 kg/m2  SpO2 99% Wt Readings from Last 3 Encounters:  06/18/14 125 lb 4 oz (56.813 kg)  11/27/13 124 lb (56.246 kg)  11/07/13 125 lb 12.8 oz (57.063 kg)   Constitutional: She appears well-developed and well-nourished. No distress.  HENT: Minimal enlargement of R parotid, nontender. No purulence expressed from salivary duct or evidence of inflammation. No other lymphadenopathy or gland enlargement appreciated. Ears bilaterally clear without cerumen or erythema, no Neck: Normal range of motion. Neck supple. No JVD or LAD present. No thyromegaly present.  Cardiovascular: Normal rate, regular rhythm and normal heart sounds.  No murmur heard. No BLE  edema. Pulmonary/Chest: Effort normal and breath sounds normal. No respiratory distress. She has no wheezes.  Psychiatric: She has a normal mood and affect. Her behavior is normal. Judgment and thought content normal.   Lab Results  Component Value Date   WBC 5.3 11/07/2013   HGB 11.6* 11/07/2013   HCT 33.9* 11/07/2013   PLT 270.0 11/07/2013   GLUCOSE 90 11/07/2013   CHOL 195 11/07/2013   TRIG 110.0 11/07/2013   HDL 65.50 11/07/2013   LDLDIRECT 105.3 08/03/2010   LDLCALC 108* 11/07/2013   ALT 15 11/07/2013   AST 19 11/07/2013   NA 139 11/07/2013   K 3.7 11/07/2013   CL 105 11/07/2013   CREATININE 0.6 11/07/2013   BUN 14 11/07/2013   CO2 27 11/07/2013   TSH 2.44 11/07/2013    Dg Bone Density  01/04/2014   Findings : lowest T score - 1.2  FRAX (calculated fracture risk) not elevated Diagnosis:minor Osteopenia Repeat BMD in 3-5 years         Assessment & Plan:   Isolated R parotid swelling - exam consistent with mild sialadenitis -?viral versus stone, both now spontaneously improved -education reassurance provided. Patient to call if recurrent symptoms, fever, worsening pain or other problems  Problem List Items Addressed This Visit   CERVICALGIA     Chronic pain -  chronic myofascial spasm L shoulder blade region, no neuro deficits on exam Working with neuro, PM&R (saullo)  and PT on same Resume muscle relaxer - refill today Continue NSAIDs    HYPOTHYROIDISM      On replacement for same Check annually and as needed, adjust as needed   Lab Results  Component Value Date   TSH 2.44 11/07/2013      Relevant Orders      TSH    Other Visit Diagnoses   Sialoadenitis    -  Primary

## 2014-06-18 NOTE — Patient Instructions (Addendum)
It was good to see you today.  We have reviewed your prior records including labs and tests today  Test(s) ordered today. Your results will be released to MyChart (or called to you) after review, usually within 72hours after test completion. If any changes need to be made, you will be notified at that same time.  Medications reviewed and updated, no changes recommended at this time. Refill on medication(s) as discussed today.  Please schedule followup in 3-4 months for annual exam and labs, call sooner if problems.

## 2014-06-18 NOTE — Assessment & Plan Note (Signed)
Chronic pain -  chronic myofascial spasm L shoulder blade region, no neuro deficits on exam Working with neuro, PM&R (saullo) and PT on same Resume muscle relaxer - refill today Continue NSAIDs

## 2014-06-18 NOTE — Assessment & Plan Note (Signed)
On replacement for same Check annually and as needed, adjust as needed   Lab Results  Component Value Date   TSH 2.44 11/07/2013

## 2014-06-18 NOTE — Progress Notes (Signed)
Pre visit review using our clinic review tool, if applicable. No additional management support is needed unless otherwise documented below in the visit note. 

## 2014-09-02 ENCOUNTER — Other Ambulatory Visit: Payer: Self-pay | Admitting: Rehabilitation

## 2014-09-02 DIAGNOSIS — R1900 Intra-abdominal and pelvic swelling, mass and lump, unspecified site: Secondary | ICD-10-CM

## 2014-09-11 ENCOUNTER — Ambulatory Visit
Admission: RE | Admit: 2014-09-11 | Discharge: 2014-09-11 | Disposition: A | Discharge status: Home / Self Care | Payer: BC Managed Care – PPO | Source: Ambulatory Visit | Attending: Rehabilitation | Admitting: Rehabilitation

## 2014-09-11 DIAGNOSIS — R1900 Intra-abdominal and pelvic swelling, mass and lump, unspecified site: Secondary | ICD-10-CM

## 2014-09-11 MED ORDER — GADOBENATE DIMEGLUMINE 529 MG/ML IV SOLN
10.0000 mL | Freq: Once | INTRAVENOUS | Status: AC | PRN
  Administered 2014-09-11: 10 mL via INTRAVENOUS

## 2014-09-29 ENCOUNTER — Other Ambulatory Visit: Payer: Self-pay | Admitting: Neurology

## 2014-09-29 DIAGNOSIS — M5417 Radiculopathy, lumbosacral region: Secondary | ICD-10-CM

## 2014-10-04 ENCOUNTER — Ambulatory Visit
Admission: RE | Admit: 2014-10-04 | Discharge: 2014-10-04 | Disposition: A | Discharge status: Home / Self Care | Payer: BC Managed Care – PPO | Source: Ambulatory Visit | Attending: Neurology | Admitting: Neurology

## 2014-10-04 DIAGNOSIS — M5417 Radiculopathy, lumbosacral region: Secondary | ICD-10-CM

## 2014-11-04 ENCOUNTER — Other Ambulatory Visit: Payer: Self-pay | Admitting: Internal Medicine

## 2014-11-19 ENCOUNTER — Other Ambulatory Visit: Payer: Self-pay | Admitting: Neurology

## 2014-11-19 DIAGNOSIS — R200 Anesthesia of skin: Secondary | ICD-10-CM

## 2014-11-19 LAB — CBC AND DIFFERENTIAL
HEMATOCRIT: 37 % (ref 36–46)
HEMOGLOBIN: 12.3 g/dL (ref 12.0–16.0)
Hemoglobin: 12.3 g/dL (ref 12.0–16.0)
PLATELETS: 302 10*3/uL (ref 150–399)
PLATELETS: 302 10*3/uL (ref 150–399)
WBC: 4.2 10^3/mL
WBC: 4.2 10^3/mL

## 2014-11-19 LAB — BASIC METABOLIC PANEL
CREATININE: 0.5 mg/dL (ref 0.5–1.1)
Creatinine: 0.5 mg/dL (ref <=1.1)
GLUCOSE: 87 mg/dL
Glucose: 87 mg/dL

## 2014-11-19 LAB — HEPATIC FUNCTION PANEL
ALT: 18 U/L (ref 7–35)
AST: 13 U/L (ref 13–35)

## 2014-11-19 LAB — HEMOGLOBIN A1C: HEMOGLOBIN A1C: 5.1 % (ref 4.0–6.0)

## 2014-11-19 LAB — TSH: TSH: 2.16 u[IU]/mL (ref 0.41–5.90)

## 2014-11-23 DIAGNOSIS — G629 Polyneuropathy, unspecified: Principal | ICD-10-CM

## 2014-11-23 DIAGNOSIS — G6289 Other specified polyneuropathies: Secondary | ICD-10-CM

## 2014-11-23 HISTORY — DX: Other specified polyneuropathies: G62.89

## 2014-11-28 ENCOUNTER — Ambulatory Visit
Admission: RE | Admit: 2014-11-28 | Discharge: 2014-11-28 | Disposition: A | Discharge status: Home / Self Care | Payer: Self-pay | Source: Ambulatory Visit | Attending: Neurology | Admitting: Neurology

## 2014-11-28 DIAGNOSIS — R200 Anesthesia of skin: Secondary | ICD-10-CM

## 2015-02-03 ENCOUNTER — Other Ambulatory Visit: Payer: Self-pay | Admitting: Internal Medicine

## 2015-05-03 ENCOUNTER — Other Ambulatory Visit: Payer: Self-pay | Admitting: Internal Medicine

## 2015-06-15 ENCOUNTER — Encounter: Payer: Self-pay | Admitting: Internal Medicine

## 2015-06-15 ENCOUNTER — Ambulatory Visit (INDEPENDENT_AMBULATORY_CARE_PROVIDER_SITE_OTHER): Payer: BLUE CROSS/BLUE SHIELD | Admitting: Internal Medicine

## 2015-06-15 VITALS — BP 110/64 | HR 73 | Temp 97.9°F | Ht 62.0 in | Wt 121.5 lb

## 2015-06-15 DIAGNOSIS — E039 Hypothyroidism, unspecified: Secondary | ICD-10-CM

## 2015-06-15 DIAGNOSIS — G629 Polyneuropathy, unspecified: Secondary | ICD-10-CM | POA: Diagnosis not present

## 2015-06-15 DIAGNOSIS — H6121 Impacted cerumen, right ear: Secondary | ICD-10-CM | POA: Diagnosis not present

## 2015-06-15 DIAGNOSIS — F411 Generalized anxiety disorder: Secondary | ICD-10-CM

## 2015-06-15 DIAGNOSIS — G6289 Other specified polyneuropathies: Secondary | ICD-10-CM

## 2015-06-15 LAB — RPR, QUANT
IGM (IMMUNOGLOBULIN M), SRM: 136
IgA/Immunoglobulin A, Serum: 179
IgG (Immunoglobin G), Serum: 1190
TOTAL PROTEIN: 7.2 g/dL

## 2015-06-15 MED ORDER — DULOXETINE HCL 30 MG PO CPEP: 30.0000 mg | ORAL_CAPSULE | Freq: Every day | ORAL | Status: DC

## 2015-06-15 NOTE — Assessment & Plan Note (Signed)
On replacement for same Check annually and as needed, adjust as needed   Lab Results  Component Value Date   TSH 2.16 11/19/2014   

## 2015-06-15 NOTE — Progress Notes (Signed)
Pre visit review using our clinic review tool, if applicable. No additional management support is needed unless otherwise documented below in the visit note. 

## 2015-06-15 NOTE — Assessment & Plan Note (Signed)
Exacerbated by pain from neuropathy May benefit from cymbalata as starting for neuropathy Support offered

## 2015-06-15 NOTE — Assessment & Plan Note (Signed)
Diagnosed 11/2014 by 2nd neuro opinion as cause of constant burning and numbness - affecting life quality Has not responded to Lyrica and chiropractic therapy (laser with [facility] Knife chiropractor) cymbalta potential benefit reviewed - pt agrees to try same

## 2015-06-15 NOTE — Progress Notes (Signed)
Subjective:    Patient ID: Sharon Stephenson, female    DOB: [birth year], 53 y.o.   MRN: [id number]  HPI  Patient here for followup  Past Medical History  Diagnosis Date  . ANEMIA, OTHER UNSPEC   . ANXIETY   . DEPRESSION   . HYPOTHYROIDISM   . Cervicalgia     chronic - follows with neuro in [place]  . Lumbar radicular syndrome spring 2013  . Osteopenia 01/05/2014    DEXA @ [facility] 12/2013: -1.2  . Small fiber polyneuropathy 11/2014    Dr. Markham Jordan    Review of Systems  Constitutional: Positive for fatigue. Negative for unexpected weight change.  HENT: Positive for hearing loss (right).   Respiratory: Negative for cough and shortness of breath.   Cardiovascular: Negative for chest pain.  Musculoskeletal: Positive for myalgias, back pain, neck pain and neck stiffness.  Neurological: Positive for numbness (B feet, severe).  Psychiatric/Behavioral: Positive for sleep disturbance and dysphoric mood. The patient is nervous/anxious.        Objective:    Physical Exam  Constitutional: She appears well-developed and well-nourished. No distress.  HENT:  Right cerumen impaction, clear after irrigation  Cardiovascular: Normal rate, regular rhythm and normal heart sounds.   No murmur heard. Pulmonary/Chest: Effort normal and breath sounds normal. No respiratory distress.  Musculoskeletal: She exhibits no edema.  Psychiatric:  anxious    BP 110/64 mmHg  Pulse 73  Temp(Src) 97.9 F (36.6 C) (Oral)  Ht 5\' 2"  (1.575 m)  Wt 121 lb 8 oz (55.112 kg)  BMI 22.22 kg/m2  SpO2 98% Wt Readings from Last 3 Encounters:  06/15/15 121 lb 8 oz (55.112 kg)  06/18/14 125 lb 4 oz (56.813 kg)  11/27/13 124 lb (56.246 kg)     Lab Results  Component Value Date   WBC 4.2 11/19/2014   HGB 12.3 11/19/2014   HCT 33.9* 11/07/2013   PLT 302 11/19/2014   GLUCOSE 90 11/07/2013   CHOL 195 11/07/2013   TRIG 110.0 11/07/2013   HDL 65.50 11/07/2013   LDLDIRECT 105.3 08/03/2010   LDLCALC 108* 11/07/2013   ALT  18 11/19/2014   AST 13 11/19/2014   NA 139 11/07/2013   K 3.7 11/07/2013   CL 105 11/07/2013   CREATININE 0.5 11/19/2014   BUN 14 11/07/2013   CO2 27 11/07/2013   TSH 2.16 11/19/2014   HGBA1C 5.1 11/19/2014   Procedure: wax removal, r Reason: wax impaction, R Risks and benefits of procedure discussed with the patient who agrees to proceed. Ear irrigated with warm water. Large amount of wax removed. Instrumentation with metal ear loop was performed to accomplish wax removal. the patient tolerated procedure well.   Mr Cervical Spine Wo Contrast  11/29/2014   CLINICAL DATA:  Numbness and tingling in both feet intermittently for 10+ years. Tingling and pain in both upper extremities for nearly 1 year. Possible demyelinating disease or spinal stenosis.  EXAM: MRI CERVICAL SPINE WITHOUT CONTRAST; MRI THORACIC SPINE WITHOUT CONTRAST  TECHNIQUE: Multiplanar and multiecho pulse sequences of the cervical spine, to include the craniocervical junction and cervicothoracic junction, were obtained according to standard protocol without intravenous contrast.; Multiplanar and multiecho pulse sequences of the thoracic spine were obtained without intravenous contrast.  COMPARISON:  Cervical spine radiographs 02/14/2012. Chest radiographs 12/18/2003.  FINDINGS: MR CERVICAL SPINE FINDINGS:  There is slight reversal of the normal cervical lordosis. Grade 1 retrolisthesis of C5 on C6 is unchanged. Vertebral body heights are preserved. There is moderate to  severe disc space narrowing at C5-6 with mild narrowing at other levels in the mid and lower cervical spine. No vertebral marrow edema is seen. Craniocervical junction is unremarkable. Cervical spinal cord is normal in caliber and signal. Paraspinal soft tissues are unremarkable.  C2-3:  Negative.  C3-4: Small central disc protrusion without spinal stenosis. Minimal uncovertebral and left facet arthrosis result in minimal left neural foraminal narrowing.  C4-5:  Negative.   C5-6: Listhesis with disc uncovering and diffuse endplate spurring results in moderate right and mild left neural foraminal stenosis without spinal stenosis.  C6-7: Disc bulging and uncovertebral spurring result in mild right neural foraminal stenosis without spinal stenosis.  C7-T1:  Negative.  MR THORACIC SPINE FINDINGS:  Vertebral alignment is normal. Vertebral body heights are preserved. Thoracic vertebral marrow signal is within normal limits. Thoracic spinal cord is normal in caliber and signal. Conus medullaris terminates at L1. No thoracic disc herniation, spinal stenosis, or neural foraminal stenosis is identified. Paraspinal soft tissues are unremarkable.  IMPRESSION: 1. Moderate multilevel cervical disc degeneration, worst at C5-6 where there is moderate right and mild left neural foraminal stenosis. 2. No thoracic disc herniation or stenosis. 3. Normal appearance of the spinal cord.   Electronically Signed   By: Sebastian Ache   On: 11/29/2014 11:29   Mr Thoracic Spine Wo Contrast  11/29/2014   CLINICAL DATA:  Numbness and tingling in both feet intermittently for 10+ years. Tingling and pain in both upper extremities for nearly 1 year. Possible demyelinating disease or spinal stenosis.  EXAM: MRI CERVICAL SPINE WITHOUT CONTRAST; MRI THORACIC SPINE WITHOUT CONTRAST  TECHNIQUE: Multiplanar and multiecho pulse sequences of the cervical spine, to include the craniocervical junction and cervicothoracic junction, were obtained according to standard protocol without intravenous contrast.; Multiplanar and multiecho pulse sequences of the thoracic spine were obtained without intravenous contrast.  COMPARISON:  Cervical spine radiographs 02/14/2012. Chest radiographs 12/18/2003.  FINDINGS: MR CERVICAL SPINE FINDINGS:  There is slight reversal of the normal cervical lordosis. Grade 1 retrolisthesis of C5 on C6 is unchanged. Vertebral body heights are preserved. There is moderate to severe disc space narrowing at  C5-6 with mild narrowing at other levels in the mid and lower cervical spine. No vertebral marrow edema is seen. Craniocervical junction is unremarkable. Cervical spinal cord is normal in caliber and signal. Paraspinal soft tissues are unremarkable.  C2-3:  Negative.  C3-4: Small central disc protrusion without spinal stenosis. Minimal uncovertebral and left facet arthrosis result in minimal left neural foraminal narrowing.  C4-5:  Negative.  C5-6: Listhesis with disc uncovering and diffuse endplate spurring results in moderate right and mild left neural foraminal stenosis without spinal stenosis.  C6-7: Disc bulging and uncovertebral spurring result in mild right neural foraminal stenosis without spinal stenosis.  C7-T1:  Negative.  MR THORACIC SPINE FINDINGS:  Vertebral alignment is normal. Vertebral body heights are preserved. Thoracic vertebral marrow signal is within normal limits. Thoracic spinal cord is normal in caliber and signal. Conus medullaris terminates at L1. No thoracic disc herniation, spinal stenosis, or neural foraminal stenosis is identified. Paraspinal soft tissues are unremarkable.  IMPRESSION: 1. Moderate multilevel cervical disc degeneration, worst at C5-6 where there is moderate right and mild left neural foraminal stenosis. 2. No thoracic disc herniation or stenosis. 3. Normal appearance of the spinal cord.   Electronically Signed   By: Sebastian Ache   On: 11/29/2014 11:29       Assessment & Plan:   Right side cerumen impaction.  Irrigation today as above with improvement. Patient education and reassurance provided  Problem List Items Addressed This Visit    Anxiety state    Exacerbated by pain from neuropathy May benefit from cymbalata as starting for neuropathy Support offered      Relevant Medications   DULoxetine (CYMBALTA) 30 MG capsule   Hypothyroidism    On replacement for same Check annually and as needed, adjust as needed   Lab Results  Component Value Date    TSH 2.16 11/19/2014        Small fiber polyneuropathy - Primary    Diagnosed 11/2014 by 2nd neuro opinion as cause of constant burning and numbness - affecting life quality Has not responded to Lyrica and chiropractic therapy (laser with [facility] chiropractor) cymbalta potential benefit reviewed - pt agrees to try same          Rene Paci, MD

## 2015-06-15 NOTE — Patient Instructions (Signed)
It was good to see you today.  We have reviewed your prior records including labs and tests today  Tart Cymbalta 30 mg once daily for pain and anxiety symptoms. If ready, okay to increase to 60 mg daily -let us know if refill for 60 or 30 mg tablets as necessary between now and her next scheduled appointment for physical  Other medications reviewed and updated, no changes recommended at this time.  Your prescription(s) have been submitted to your pharmacy. Please take as directed and contact our office if you believe you are having problem(s) with the medication(s)  Please schedule followup in 3-4 months for annual physical, call sooner if problems.

## 2015-06-17 ENCOUNTER — Encounter: Payer: Self-pay | Admitting: Gastroenterology

## 2015-06-30 ENCOUNTER — Other Ambulatory Visit: Payer: Self-pay | Admitting: Internal Medicine

## 2015-09-22 ENCOUNTER — Other Ambulatory Visit (INDEPENDENT_AMBULATORY_CARE_PROVIDER_SITE_OTHER): Payer: BLUE CROSS/BLUE SHIELD

## 2015-09-22 ENCOUNTER — Encounter: Payer: Self-pay | Admitting: Internal Medicine

## 2015-09-22 ENCOUNTER — Ambulatory Visit (INDEPENDENT_AMBULATORY_CARE_PROVIDER_SITE_OTHER): Payer: BLUE CROSS/BLUE SHIELD | Admitting: Internal Medicine

## 2015-09-22 VITALS — BP 90/60 | HR 65 | Temp 97.9°F | Ht 62.0 in | Wt 124.2 lb

## 2015-09-22 DIAGNOSIS — Z0000 Encounter for general adult medical examination without abnormal findings: Secondary | ICD-10-CM | POA: Diagnosis not present

## 2015-09-22 DIAGNOSIS — M8580 Other specified disorders of bone density and structure, unspecified site: Secondary | ICD-10-CM

## 2015-09-22 DIAGNOSIS — G6289 Other specified polyneuropathies: Secondary | ICD-10-CM

## 2015-09-22 DIAGNOSIS — G629 Polyneuropathy, unspecified: Secondary | ICD-10-CM

## 2015-09-22 DIAGNOSIS — E039 Hypothyroidism, unspecified: Secondary | ICD-10-CM | POA: Diagnosis not present

## 2015-09-22 LAB — URINALYSIS, ROUTINE W REFLEX MICROSCOPIC
Bilirubin Urine: NEGATIVE
HGB URINE DIPSTICK: NEGATIVE
Ketones, ur: NEGATIVE
NITRITE: NEGATIVE
RBC / HPF: NONE SEEN (ref 0–?)
Total Protein, Urine: NEGATIVE
URINE GLUCOSE: NEGATIVE
Urobilinogen, UA: 0.2 (ref 0.0–1.0)
pH: 7 (ref 5.0–8.0)

## 2015-09-22 LAB — CBC WITH DIFFERENTIAL/PLATELET
Basophils Absolute: 0 10*3/uL (ref 0.0–0.1)
Basophils Relative: 0.8 % (ref 0.0–3.0)
EOS ABS: 0.1 10*3/uL (ref 0.0–0.7)
Eosinophils Relative: 1.2 % (ref 0.0–5.0)
HCT: 39 % (ref 36.0–46.0)
HEMOGLOBIN: 13 g/dL (ref 12.0–15.0)
LYMPHS ABS: 2 10*3/uL (ref 0.7–4.0)
Lymphocytes Relative: 35.2 % (ref 12.0–46.0)
MCHC: 33.4 g/dL (ref 30.0–36.0)
MCV: 85.6 fl (ref 78.0–100.0)
MONO ABS: 0.3 10*3/uL (ref 0.1–1.0)
Monocytes Relative: 6 % (ref 3.0–12.0)
NEUTROS PCT: 56.8 % (ref 43.0–77.0)
Neutro Abs: 3.2 10*3/uL (ref 1.4–7.7)
Platelets: 281 10*3/uL (ref 150.0–400.0)
RBC: 4.55 Mil/uL (ref 3.87–5.11)
RDW: 14.3 % (ref 11.5–15.5)
WBC: 5.6 10*3/uL (ref 4.0–10.5)

## 2015-09-22 LAB — BASIC METABOLIC PANEL
BUN: 18 mg/dL (ref 6–23)
CO2: 30 mEq/L (ref 19–32)
CREATININE: 0.54 mg/dL (ref 0.40–1.20)
Calcium: 9.4 mg/dL (ref 8.4–10.5)
Chloride: 101 mEq/L (ref 96–112)
GFR: 124.51 mL/min (ref >60.00)
Glucose, Bld: 99 mg/dL (ref 70–99)
Potassium: 3.7 mEq/L (ref 3.5–5.1)
Sodium: 139 mEq/L (ref 135–145)

## 2015-09-22 LAB — HEPATIC FUNCTION PANEL
ALT: 12 U/L (ref 0–35)
AST: 16 U/L (ref 0–37)
Albumin: 4.5 g/dL (ref 3.5–5.2)
Alkaline Phosphatase: 55 U/L (ref 39–117)
BILIRUBIN DIRECT: 0.2 mg/dL (ref 0.0–0.3)
BILIRUBIN TOTAL: 1.7 mg/dL — AB (ref 0.2–1.2)
Total Protein: 7.6 g/dL (ref 6.0–8.3)

## 2015-09-22 LAB — LIPID PANEL
CHOL/HDL RATIO: 3
Cholesterol: 201 mg/dL — ABNORMAL HIGH (ref 0–200)
HDL: 76 mg/dL (ref >39.00)
LDL CALC: 110 mg/dL — AB (ref 0–99)
NonHDL: 125.17
TRIGLYCERIDES: 74 mg/dL (ref 0.0–149.0)
VLDL: 14.8 mg/dL (ref 0.0–40.0)

## 2015-09-22 LAB — VITAMIN D 25 HYDROXY (VIT D DEFICIENCY, FRACTURES): VITD: 23.2 ng/mL — AB (ref 30.00–100.00)

## 2015-09-22 LAB — VITAMIN B12: Vitamin B-12: 914 pg/mL — ABNORMAL HIGH (ref 211–911)

## 2015-09-22 LAB — TSH: TSH: 3.36 u[IU]/mL (ref 0.35–4.50)

## 2015-09-22 NOTE — Progress Notes (Signed)
Pre visit review using our clinic review tool, if applicable. No additional management support is needed unless otherwise documented below in the visit note. 

## 2015-09-22 NOTE — Assessment & Plan Note (Signed)
On replacement for same Check annually and as needed, adjust as needed   Lab Results  Component Value Date   TSH 2.16 11/19/2014

## 2015-09-22 NOTE — Assessment & Plan Note (Signed)
Diagnosed 11/2014 by 2nd neuro opinion ([facility]) as cause of constant burning and numbness in hands/feet - affecting life quality - has folowed up with [facility] since Has not responded to Lyrica and chiropractic therapy (laser with [facility]) Also ineffective relief with 30 d trial of cymbalta 06/2015 or gabapentin qHS Working on meditation and accepting the symptoms as is

## 2015-09-22 NOTE — Assessment & Plan Note (Signed)
Plan follow up in spring 2017 to monitor (-1.2 in 12/2013) Continue WB exercises and Ca+D OTC

## 2015-09-22 NOTE — Progress Notes (Signed)
Subjective:    Patient ID: Sharon Stephenson, female    DOB: 04/13/1961, 54 y.o.   MRN: [id number]  HPI  patient is here today for annual physical. Patient feels well overall Also reviewed chronic medical conditions, interval events and current concerns   Past Medical History  Diagnosis Date  . ANEMIA, OTHER UNSPEC   . ANXIETY   . DEPRESSION   . HYPOTHYROIDISM   . Cervicalgia     chronic - follows with neuro in [place]  . Lumbar radicular syndrome spring 2013  . Osteopenia 01/05/2014    DEXA @ [facility] 12/2013: -1.2  . Small fiber polyneuropathy ([facility]) 11/2014    Dr. Asa LenteElliot [facility]   Family History  Problem Relation Age of Onset  . Kidney disease Father   . Colon cancer Maternal Grandmother    Social History  Substance Use Topics  . Smoking status: Former Smoker    Quit date: 10/23/1985  . Smokeless tobacco: None  . Alcohol Use: 0.0 oz/week    0 Standard drinks or equivalent per week    Review of Systems  Constitutional: Negative for fatigue and unexpected weight change.  Respiratory: Negative for cough, shortness of breath and wheezing.   Cardiovascular: Negative for chest pain, palpitations and leg swelling.  Gastrointestinal: Negative for nausea, abdominal pain and diarrhea.  Neurological: Positive for numbness (chronic). Negative for dizziness, weakness, light-headedness and headaches.  Psychiatric/Behavioral: Negative for dysphoric mood. The patient is not nervous/anxious.   All other systems reviewed and are negative.   Patient Care Team: Newt LukesValerie A Ryver Poblete, MD as PCP - General Louis Meckelobert D Kaplan, MD as Consulting Physician (Gastroenterology) Vallery Ridgeichard Bey, MD as Consulting Physician (Neurology) Sidney Aceanjan Sharma, MD as Consulting Physician (Allergy) Venancio PoissonLaura Lomax, MD as Consulting Physician (Dermatology) Letta Kocherhomas Saullo, MD (Rehabilitation) Dominica SeverinWilliam Gramig, MD (Orthopedic Surgery) Philip AspenSidney Callahan, DO (Obstetrics and Gynecology) Toni ArthursJohn Hewitt, MD (Orthopedic Surgery)     Objective:      Physical Exam  Constitutional: She is oriented to person, place, and time. She appears well-developed and well-nourished. No distress.  HENT:  Head: Normocephalic and atraumatic.  Right Ear: External ear normal.  Left Ear: External ear normal.  Nose: Nose normal.  Mouth/Throat: Oropharynx is clear and moist. No oropharyngeal exudate.  Eyes: EOM are normal. Pupils are equal, round, and reactive to light. Right eye exhibits no discharge. Left eye exhibits no discharge. No scleral icterus.  Neck: Normal range of motion. Neck supple. No JVD present. No tracheal deviation present. No thyromegaly present.  Cardiovascular: Normal rate, regular rhythm, normal heart sounds and intact distal pulses.  Exam reveals no friction rub.   No murmur heard. Pulmonary/Chest: Effort normal and breath sounds normal. No respiratory distress. She has no wheezes. She has no rales. She exhibits no tenderness.  Abdominal: Soft. Bowel sounds are normal. She exhibits no distension and no mass. There is no tenderness. There is no rebound and no guarding.  Genitourinary:  Defer to gyn  Musculoskeletal: Normal range of motion.  No gross deformities  Lymphadenopathy:    She has no cervical adenopathy.  Neurological: She is alert and oriented to person, place, and time. She has normal reflexes. No cranial nerve deficit.  Skin: Skin is warm and dry. No rash noted. She is not diaphoretic. No erythema.  Psychiatric: She has a normal mood and affect. Her behavior is normal. Judgment and thought content normal.  Nursing note and vitals reviewed.   BP 90/60 mmHg  Pulse 65  Temp(Src) 97.9 F (36.6 C) (Oral)  Ht  (1.575 m)  Wt 124 lb 4 oz (56.359 kg)  BMI 22.72 kg/m2  SpO2 99% Wt Readings from Last 3 Encounters:  09/22/15 124 lb 4 oz (56.359 kg)  06/15/15 121 lb 8 oz (55.112 kg)  06/18/14 125 lb 4 oz (56.813 kg)     Lab Results  Component Value Date   WBC 4.2 11/19/2014   WBC 4.2 11/19/2014   HGB 12.3  11/19/2014   HGB 12.3 11/19/2014   HCT 37 11/19/2014   PLT 302 11/19/2014   PLT 302 11/19/2014   GLUCOSE 90 11/07/2013   CHOL 195 11/07/2013   TRIG 110.0 11/07/2013   HDL 65.50 11/07/2013   LDLDIRECT 105.3 08/03/2010   LDLCALC 108* 11/07/2013   ALT 18 11/19/2014   AST 13 11/19/2014   NA 139 11/07/2013   K 3.7 11/07/2013   CL 105 11/07/2013   CREATININE 0.5 11/19/2014   CREATININE 0.5 11/19/2014   BUN 14 11/07/2013   CO2 27 11/07/2013   TSH 2.16 11/19/2014   HGBA1C 5.1 11/19/2014    Mr Cervical Spine Wo Contrast  11/29/2014  CLINICAL DATA:  Numbness and tingling in both feet intermittently for 10+ years. Tingling and pain in both upper extremities for nearly 1 year. Possible demyelinating disease or spinal stenosis. EXAM: MRI CERVICAL SPINE WITHOUT CONTRAST; MRI THORACIC SPINE WITHOUT CONTRAST TECHNIQUE: Multiplanar and multiecho pulse sequences of the cervical spine, to include the craniocervical junction and cervicothoracic junction, were obtained according to standard protocol without intravenous contrast.; Multiplanar and multiecho pulse sequences of the thoracic spine were obtained without intravenous contrast. COMPARISON:  Cervical spine radiographs 02/14/2012. Chest radiographs 12/18/2003. FINDINGS: MR CERVICAL SPINE FINDINGS: There is slight reversal of the normal cervical lordosis. Grade 1 retrolisthesis of C5 on C6 is unchanged. Vertebral body heights are preserved. There is moderate to severe disc space narrowing at C5-6 with mild narrowing at other levels in the mid and lower cervical spine. No vertebral marrow edema is seen. Craniocervical junction is unremarkable. Cervical spinal cord is normal in caliber and signal. Paraspinal soft tissues are unremarkable. C2-3:  Negative. C3-4: Small central disc protrusion without spinal stenosis. Minimal uncovertebral and left facet arthrosis result in minimal left neural foraminal narrowing. C4-5:  Negative. C5-6: Listhesis with disc  uncovering and diffuse endplate spurring results in moderate right and mild left neural foraminal stenosis without spinal stenosis. C6-7: Disc bulging and uncovertebral spurring result in mild right neural foraminal stenosis without spinal stenosis. C7-T1:  Negative. MR THORACIC SPINE FINDINGS: Vertebral alignment is normal. Vertebral body heights are preserved. Thoracic vertebral marrow signal is within normal limits. Thoracic spinal cord is normal in caliber and signal. Conus medullaris terminates at L1. No thoracic disc herniation, spinal stenosis, or neural foraminal stenosis is identified. Paraspinal soft tissues are unremarkable. IMPRESSION: 1. Moderate multilevel cervical disc degeneration, worst at C5-6 where there is moderate right and mild left neural foraminal stenosis. 2. No thoracic disc herniation or stenosis. 3. Normal appearance of the spinal cord. Electronically Signed   By: Sebastian Ache   On: 11/29/2014 11:29   Mr Thoracic Spine Wo Contrast  11/29/2014  CLINICAL DATA:  Numbness and tingling in both feet intermittently for 10+ years. Tingling and pain in both upper extremities for nearly 1 year. Possible demyelinating disease or spinal stenosis. EXAM: MRI CERVICAL SPINE WITHOUT CONTRAST; MRI THORACIC SPINE WITHOUT CONTRAST TECHNIQUE: Multiplanar and multiecho pulse sequences of the cervical spine, to include the craniocervical junction and cervicothoracic junction, were obtained according to  standard protocol without intravenous contrast.; Multiplanar and multiecho pulse sequences of the thoracic spine were obtained without intravenous contrast. COMPARISON:  Cervical spine radiographs 02/14/2012. Chest radiographs 12/18/2003. FINDINGS: MR CERVICAL SPINE FINDINGS: There is slight reversal of the normal cervical lordosis. Grade 1 retrolisthesis of C5 on C6 is unchanged. Vertebral body heights are preserved. There is moderate to severe disc space narrowing at C5-6 with mild narrowing at other levels  in the mid and lower cervical spine. No vertebral marrow edema is seen. Craniocervical junction is unremarkable. Cervical spinal cord is normal in caliber and signal. Paraspinal soft tissues are unremarkable. C2-3:  Negative. C3-4: Small central disc protrusion without spinal stenosis. Minimal uncovertebral and left facet arthrosis result in minimal left neural foraminal narrowing. C4-5:  Negative. C5-6: Listhesis with disc uncovering and diffuse endplate spurring results in moderate right and mild left neural foraminal stenosis without spinal stenosis. C6-7: Disc bulging and uncovertebral spurring result in mild right neural foraminal stenosis without spinal stenosis. C7-T1:  Negative. MR THORACIC SPINE FINDINGS: Vertebral alignment is normal. Vertebral body heights are preserved. Thoracic vertebral marrow signal is within normal limits. Thoracic spinal cord is normal in caliber and signal. Conus medullaris terminates at L1. No thoracic disc herniation, spinal stenosis, or neural foraminal stenosis is identified. Paraspinal soft tissues are unremarkable. IMPRESSION: 1. Moderate multilevel cervical disc degeneration, worst at C5-6 where there is moderate right and mild left neural foraminal stenosis. 2. No thoracic disc herniation or stenosis. 3. Normal appearance of the spinal cord. Electronically Signed   By: Sebastian Ache   On: 11/29/2014 11:29       Assessment & Plan:   AWV/z00.00 - Patient has been counseled on age-appropriate routine health concerns for screening and prevention. These are reviewed and up-to-date. Immunizations are up-to-date or declined. Labs ordered and reviewed.  Problem List Items Addressed This Visit    Hypothyroidism    On replacement for same Check annually and as needed, adjust as needed   Lab Results  Component Value Date   TSH 2.16 11/19/2014        Osteopenia    Plan follow up in spring 2017 to monitor (-1.2 in 12/2013) Continue WB exercises and Ca+D OTC       Relevant Orders   VITAMIN D 25 Hydroxy (Vit-D Deficiency, Fractures)   Small fiber polyneuropathy (HCC)    Diagnosed 11/2014 by 2nd neuro opinion ([facility]) as cause of constant burning and numbness in hands/feet - affecting life quality - has folowed up with [facility] since Has not responded to Lyrica and chiropractic therapy (laser with [facility] chiropractor) Also ineffective relief with 30 d trial of cymbalta 06/2015 or gabapentin qHS Working on meditation and accepting the symptoms as is      Relevant Orders   VITAMIN D 25 Hydroxy (Vit-D Deficiency, Fractures)   Vitamin B12    Other Visit Diagnoses    Routine general medical examination at a health care facility    -  Primary    Relevant Orders    Basic metabolic panel    CBC with Differential/Platelet    Hepatic function panel    Lipid panel    TSH    Urinalysis, Routine w reflex microscopic (not at [facility])    VITAMIN D 25 Hydroxy (Vit-D Deficiency, Fractures)    Vitamin B12        Rene Paci, MD

## 2015-09-22 NOTE — Patient Instructions (Addendum)
It was good to see you today.  We have reviewed your prior records including labs and tests today  Health Maintenance reviewed - next bone density due 12/2015 or later - all other recommended immunizations and age-appropriate screenings are up-to-date.  Test(s) ordered today. Your results will be released to [place] (or called to you) after review, usually within 72hours after test completion. If any changes need to be made, you will be notified at that same time.  Medications reviewed and updated, no changes recommended at this time.  Please schedule followup in 4-6 months with new primary care doctor (Dr. Quay Burow) for semi annual exam and labs, call sooner if problems.   Health Maintenance, Female Adopting a healthy lifestyle and getting preventive care can go a long way to promote health and wellness. Talk with your health care provider about what schedule of regular examinations is right for you. This is a good chance for you to check in with your provider about disease prevention and staying healthy. In between checkups, there are plenty of things you can do on your own. Experts have done a lot of research about which lifestyle changes and preventive measures are most likely to keep you healthy. Ask your health care provider for more information. WEIGHT AND DIET  Eat a healthy diet  Be sure to include plenty of vegetables, fruits, low-fat dairy products, and lean protein.  Do not eat a lot of foods high in solid fats, added sugars, or salt.  Get regular exercise. This is one of the most important things you can do for your health.  Most adults should exercise for at least 150 minutes each week. The exercise should increase your heart rate and make you sweat (moderate-intensity exercise).  Most adults should also do strengthening exercises at least twice a week. This is in addition to the moderate-intensity exercise.  Maintain a healthy weight  Body mass index (BMI) is a measurement  that can be used to identify possible weight problems. It estimates body fat based on height and weight. Your health care provider can help determine your BMI and help you achieve or maintain a healthy weight.  For females 29 years of age and older:   A BMI below 18.5 is considered underweight.  A BMI of 18.5 to 24.9 is normal.  A BMI of 25 to 29.9 is considered overweight.  A BMI of 30 and above is considered obese.  Watch levels of cholesterol and blood lipids  You should start having your blood tested for lipids and cholesterol at 55 years of age, then have this test every 5 years.  You may need to have your cholesterol levels checked more often if:  Your lipid or cholesterol levels are high.  You are older than 55 years of age.  You are at high risk for heart disease.  CANCER SCREENING   Lung Cancer  Lung cancer screening is recommended for adults 13-48 years old who are at high risk for lung cancer because of a history of smoking.  A yearly low-dose CT scan of the lungs is recommended for people who:  Currently smoke.  Have quit within the past 15 years.  Have at least a 30-pack-year history of smoking. A pack year is smoking an average of one pack of cigarettes a day for 1 year.  Yearly screening should continue until it has been 15 years since you quit.  Yearly screening should stop if you develop a health problem that would prevent you from having  lung cancer treatment.  Breast Cancer  Practice breast self-awareness. This means understanding how your breasts normally appear and feel.  It also means doing regular breast self-exams. Let your health care provider know about any changes, no matter how small.  If you are in your 20s or 30s, you should have a clinical breast exam (CBE) by a health care provider every 1-3 years as part of a regular health exam.  If you are 80 or older, have a CBE every year. Also consider having a breast X-ray (mammogram) every  year.  If you have a family history of breast cancer, talk to your health care provider about genetic screening.  If you are at high risk for breast cancer, talk to your health care provider about having an MRI and a mammogram every year.  Breast cancer gene (BRCA) assessment is recommended for women who have family members with BRCA-related cancers. BRCA-related cancers include:  Breast.  Ovarian.  Tubal.  Peritoneal cancers.  Results of the assessment will determine the need for genetic counseling and BRCA1 and BRCA2 testing. Cervical Cancer Your health care provider may recommend that you be screened regularly for cancer of the pelvic organs (ovaries, uterus, and vagina). This screening involves a pelvic examination, including checking for microscopic changes to the surface of your cervix (Pap test). You may be encouraged to have this screening done every 3 years, beginning at age 33.  For women ages 62-65, health care providers may recommend pelvic exams and Pap testing every 3 years, or they may recommend the Pap and pelvic exam, combined with testing for human papilloma virus (HPV), every 5 years. Some types of HPV increase your risk of cervical cancer. Testing for HPV may also be done on women of any age with unclear Pap test results.  Other health care providers may not recommend any screening for nonpregnant women who are considered low risk for pelvic cancer and who do not have symptoms. Ask your health care provider if a screening pelvic exam is right for you.  If you have had past treatment for cervical cancer or a condition that could lead to cancer, you need Pap tests and screening for cancer for at least 20 years after your treatment. If Pap tests have been discontinued, your risk factors (such as having a new sexual partner) need to be reassessed to determine if screening should resume. Some women have medical problems that increase the chance of getting cervical cancer. In  these cases, your health care provider may recommend more frequent screening and Pap tests. Colorectal Cancer  This type of cancer can be detected and often prevented.  Routine colorectal cancer screening usually begins at 55 years of age and continues through 55 years of age.  Your health care provider may recommend screening at an earlier age if you have risk factors for colon cancer.  Your health care provider may also recommend using home test kits to check for hidden blood in the stool.  A small camera at the end of a tube can be used to examine your colon directly (sigmoidoscopy or colonoscopy). This is done to check for the earliest forms of colorectal cancer.  Routine screening usually begins at age 12.  Direct examination of the colon should be repeated every 5-10 years through 55 years of age. However, you may need to be screened more often if early forms of precancerous polyps or small growths are found. Skin Cancer  Check your skin from head to toe regularly.  Tell your health care provider about any new moles or changes in moles, especially if there is a change in a mole's shape or color.  Also tell your health care provider if you have a mole that is larger than the size of a pencil eraser.  Always use sunscreen. Apply sunscreen liberally and repeatedly throughout the day.  Protect yourself by wearing long sleeves, pants, a wide-brimmed hat, and sunglasses whenever you are outside. HEART DISEASE, DIABETES, AND HIGH BLOOD PRESSURE   High blood pressure causes heart disease and increases the risk of stroke. High blood pressure is more likely to develop in:  People who have blood pressure in the high end of the normal range (130-139/85-89 mm Hg).  People who are overweight or obese.  People who are African American.  If you are 90-63 years of age, have your blood pressure checked every 3-5 years. If you are 34 years of age or older, have your blood pressure checked  every year. You should have your blood pressure measured twice--once when you are at a hospital or clinic, and once when you are not at a hospital or clinic. Record the average of the two measurements. To check your blood pressure when you are not at a hospital or clinic, you can use:  An automated blood pressure machine at a pharmacy.  A home blood pressure monitor.  If you are between 38 years and 52 years old, ask your health care provider if you should take aspirin to prevent strokes.  Have regular diabetes screenings. This involves taking a blood sample to check your fasting blood sugar level.  If you are at a normal weight and have a low risk for diabetes, have this test once every three years after 55 years of age.  If you are overweight and have a high risk for diabetes, consider being tested at a younger age or more often. PREVENTING INFECTION  Hepatitis B  If you have a higher risk for hepatitis B, you should be screened for this virus. You are considered at high risk for hepatitis B if:  You were born in a country where hepatitis B is common. Ask your health care provider which countries are considered high risk.  Your parents were born in a high-risk country, and you have not been immunized against hepatitis B (hepatitis B vaccine).  You have HIV or AIDS.  You use needles to inject street drugs.  You live with someone who has hepatitis B.  You have had sex with someone who has hepatitis B.  You get hemodialysis treatment.  You take certain medicines for conditions, including cancer, organ transplantation, and autoimmune conditions. Hepatitis C  Blood testing is recommended for:  Everyone born from [birth year] through 1966.  Anyone with known risk factors for hepatitis C. Sexually transmitted infections (STIs)  You should be screened for sexually transmitted infections (STIs) including gonorrhea and chlamydia if:  You are sexually active and are younger than 55 years  of age.  You are older than 55 years of age and your health care provider tells you that you are at risk for this type of infection.  Your sexual activity has changed since you were last screened and you are at an increased risk for chlamydia or gonorrhea. Ask your health care provider if you are at risk.  If you do not have HIV, but are at risk, it may be recommended that you take a prescription medicine daily to prevent HIV infection. This is called pre-exposure  prophylaxis (PrEP). You are considered at risk if:  You are sexually active and do not regularly use condoms or know the HIV status of your partner(s).  You take drugs by injection.  You are sexually active with a partner who has HIV. Talk with your health care provider about whether you are at high risk of being infected with HIV. If you choose to begin PrEP, you should first be tested for HIV. You should then be tested every 3 months for as long as you are taking PrEP.  PREGNANCY   If you are premenopausal and you may become pregnant, ask your health care provider about preconception counseling.  If you may become pregnant, take 400 to 800 micrograms (mcg) of folic acid every day.  If you want to prevent pregnancy, talk to your health care provider about birth control (contraception). OSTEOPOROSIS AND MENOPAUSE   Osteoporosis is a disease in which the bones lose minerals and strength with aging. This can result in serious bone fractures. Your risk for osteoporosis can be identified using a bone density scan.  If you are 89 years of age or older, or if you are at risk for osteoporosis and fractures, ask your health care provider if you should be screened.  Ask your health care provider whether you should take a calcium or vitamin D supplement to lower your risk for osteoporosis.  Menopause may have certain physical symptoms and risks.  Hormone replacement therapy may reduce some of these symptoms and risks. Talk to your  health care provider about whether hormone replacement therapy is right for you.  HOME CARE INSTRUCTIONS   Schedule regular health, dental, and eye exams.  Stay current with your immunizations.   Do not use any tobacco products including cigarettes, chewing tobacco, or electronic cigarettes.  If you are pregnant, do not drink alcohol.  If you are breastfeeding, limit how much and how often you drink alcohol.  Limit alcohol intake to no more than 1 drink per day for nonpregnant women. One drink equals 12 ounces of beer, 5 ounces of wine, or 1 ounces of hard liquor.  Do not use street drugs.  Do not share needles.  Ask your health care provider for help if you need support or information about quitting drugs.  Tell your health care provider if you often feel depressed.  Tell your health care provider if you have ever been abused or do not feel safe at home.   This information is not intended to replace advice given to you by your health care provider. Make sure you discuss any questions you have with your health care provider.   Document Released: 04/24/2011 Document Revised: 10/30/2014 Document Reviewed: 09/10/2013 Elsevier Interactive Patient Education [employer].

## 2016-01-23 IMAGING — MR MR THORACIC SPINE W/O CM
4 of 11 series · 13 of 48 positions shown · non-contrast
Comparison: Cervical spine radiographs 02/14/2012. Chest
radiographs 12/18/2003.

CLINICAL DATA: Numbness and tingling in both feet intermittently
for 10+ years. Tingling and pain in both upper extremities for
nearly 1 year. Possible demyelinating disease or spinal stenosis.

EXAM:
MRI CERVICAL SPINE WITHOUT CONTRAST; MRI THORACIC SPINE WITHOUT
CONTRAST
TECHNIQUE: Multiplanar and multiecho pulse sequences of the cervical spine, to
include the craniocervical junction and cervicothoracic junction,
were obtained according to standard protocol without intravenous
contrast.; Multiplanar and multiecho pulse sequences of the thoracic
spine were obtained without intravenous contrast.

[Series 2: T2 · sagittal · 3.0mm · 0.39mm/px · 3 of 12 slices shown (1 of 4)]
[im 1/12]
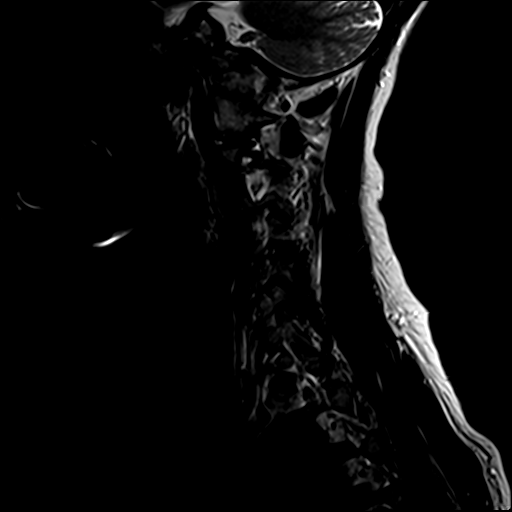
[im 6/12]
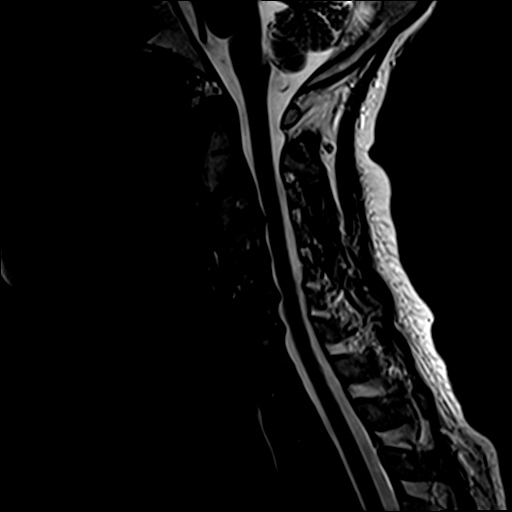
[im 12/12]
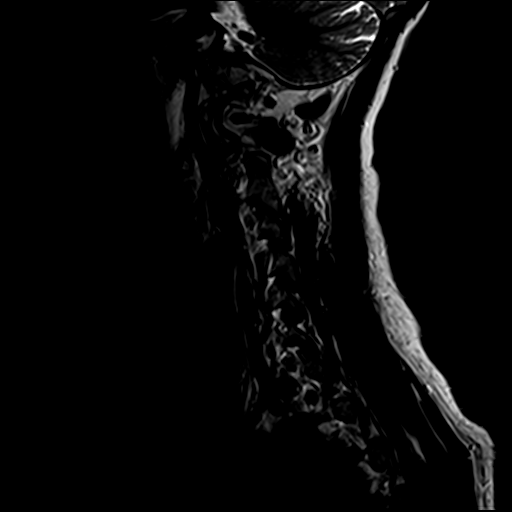

[Series 4: T2 · axial · 3.0mm · 0.33mm/px · z∈[-94,-24]mm · 4 of 24 slices shown (2 of 4)]
[im 1/24]
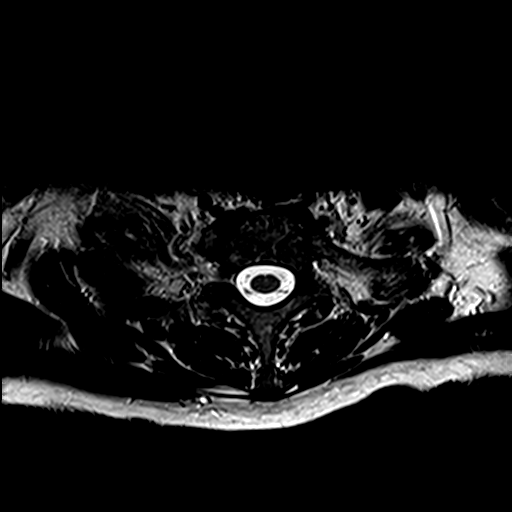
[im 4/24]
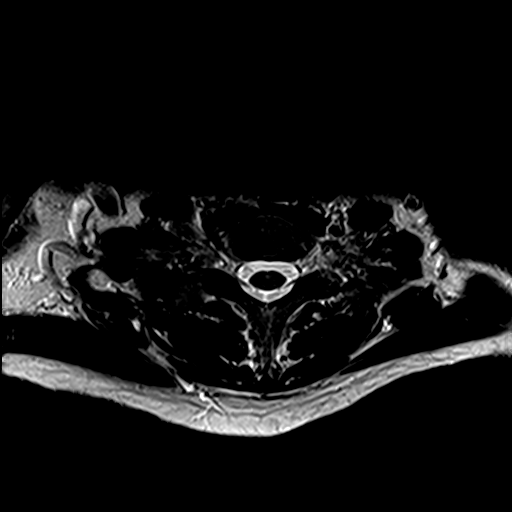
[im 14/24]
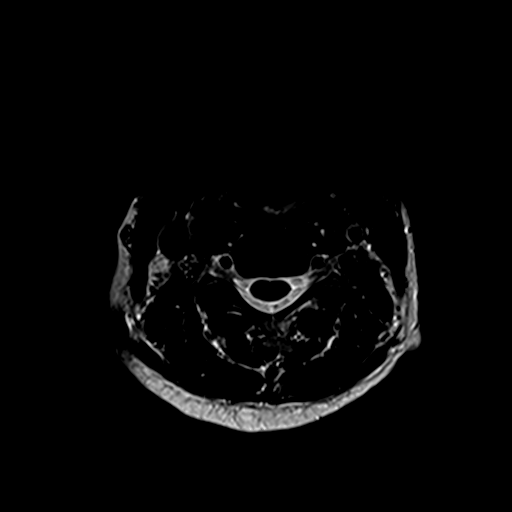
[im 20/24]
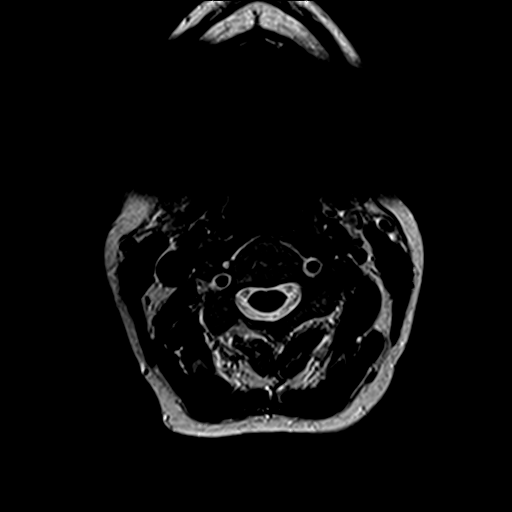

[Series 9: T2 · sagittal · 4.0mm · 0.36mm/px · 3 of 12 slices shown (3 of 4)]
[im 1/12]
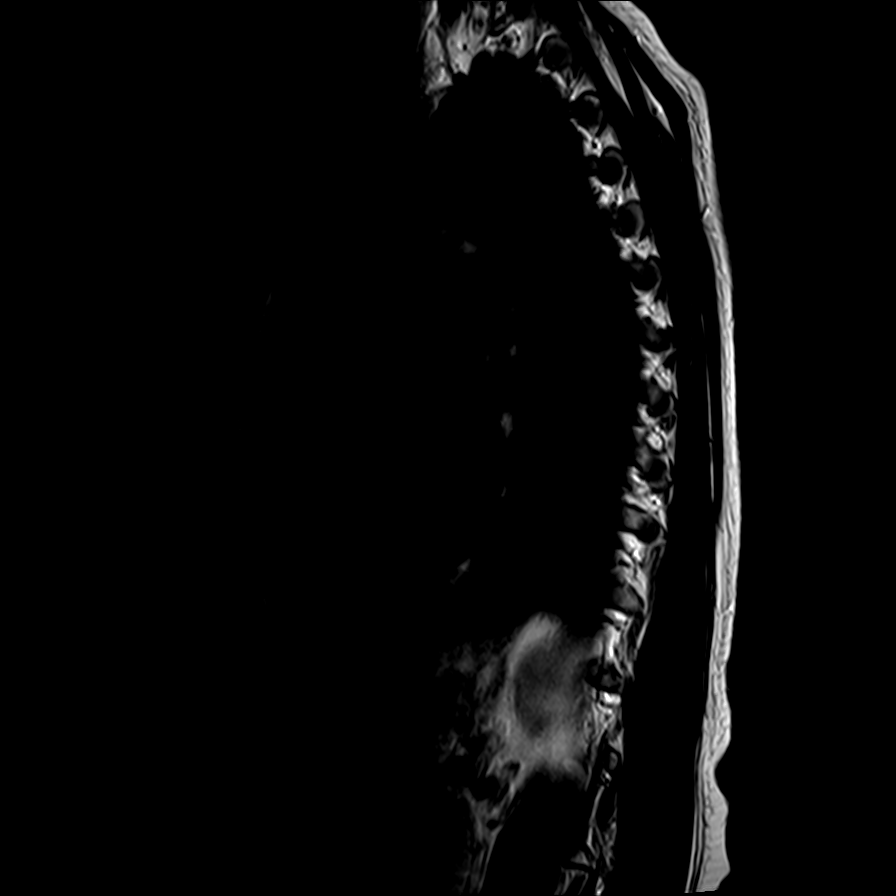
[im 8/12]
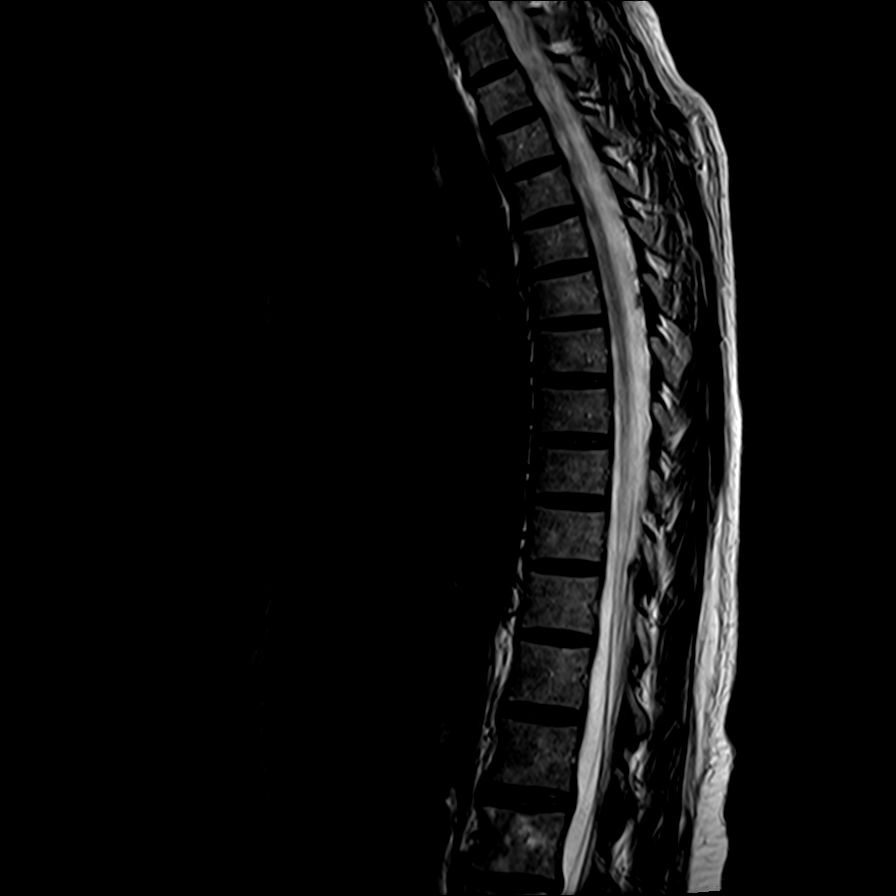
[im 12/12]
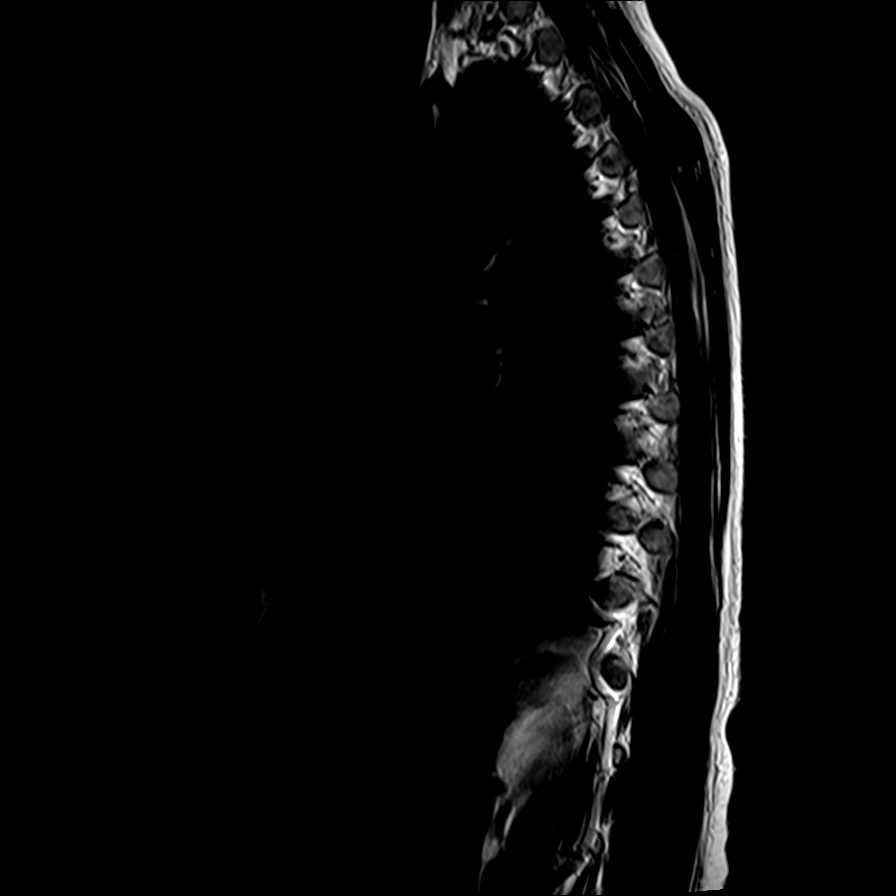

[Series 12: T2 · axial · 3.5mm · 0.45mm/px · z∈[-284,-114]mm · 3 of 12 slices shown (4 of 4)]
[im 1/12]
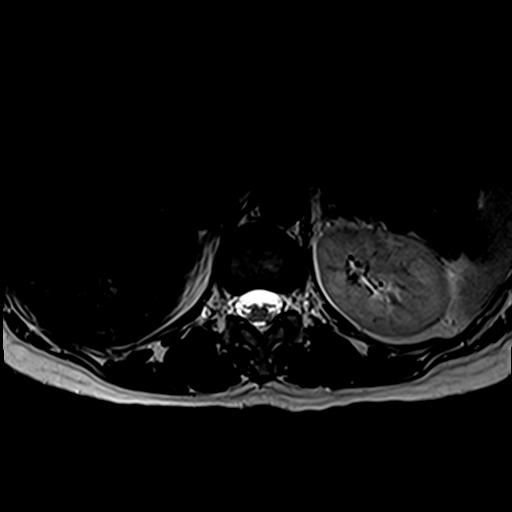
[im 8/12]
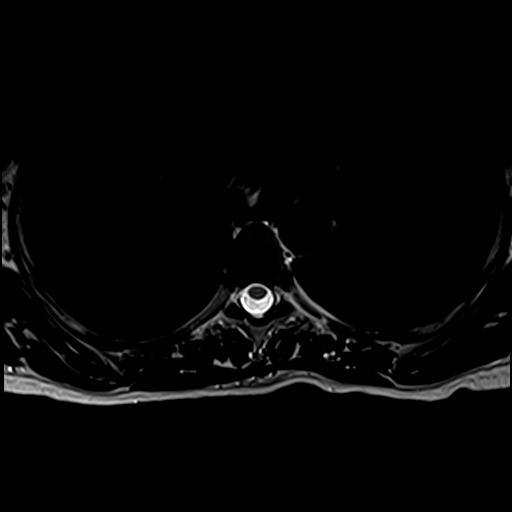
[im 12/12]
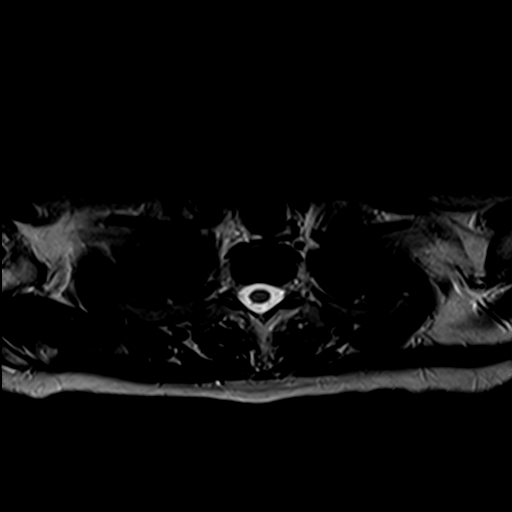

[13 of 48 positions shown; findings below may reference images not displayed]

FINDINGS: MR CERVICAL SPINE FINDINGS:

There is slight reversal of the normal cervical lordosis. Grade 1
retrolisthesis of C5 on C6 is unchanged. Vertebral body heights are
preserved. There is moderate to severe disc space narrowing at C5-6
with mild narrowing at other levels in the mid and lower cervical
spine. No vertebral marrow edema is seen. Craniocervical junction is
unremarkable. Cervical spinal cord is normal in caliber and signal.
Paraspinal soft tissues are unremarkable.

C2-3:  Negative.

C3-4: Small central disc protrusion without spinal stenosis. Minimal
uncovertebral and left facet arthrosis result in minimal left neural
foraminal narrowing.

C4-5:  Negative.

C5-6: Listhesis with disc uncovering and diffuse endplate spurring
results in moderate right and mild left neural foraminal stenosis
without spinal stenosis.

C6-7: Disc bulging and uncovertebral spurring result in mild right
neural foraminal stenosis without spinal stenosis.

C7-T1:  Negative.

MR THORACIC SPINE FINDINGS:

Vertebral alignment is normal. Vertebral body heights are preserved.
Thoracic vertebral marrow signal is within normal limits. Thoracic
spinal cord is normal in caliber and signal. Conus medullaris
terminates at L1. No thoracic disc herniation, spinal stenosis, or
neural foraminal stenosis is identified. Paraspinal soft tissues are
unremarkable.
IMPRESSION: 1. Moderate multilevel cervical disc degeneration, worst at C5-6
where there is moderate right and mild left neural foraminal
stenosis.
2. No thoracic disc herniation or stenosis.
3. Normal appearance of the spinal cord.

## 2016-02-14 ENCOUNTER — Inpatient Hospital Stay: Admission: RE | Admit: 2016-02-14 | Payer: BLUE CROSS/BLUE SHIELD | Source: Ambulatory Visit

## 2016-02-14 ENCOUNTER — Ambulatory Visit: Payer: BLUE CROSS/BLUE SHIELD | Admitting: Internal Medicine

## 2016-02-14 ENCOUNTER — Telehealth: Payer: Self-pay | Admitting: Internal Medicine

## 2016-02-14 DIAGNOSIS — M8580 Other specified disorders of bone density and structure, unspecified site: Secondary | ICD-10-CM

## 2016-02-14 NOTE — Telephone Encounter (Signed)
Pt will be transferring care with you on 5/10 and will be getting her bone density at 2:30 and she will need an order put in for it.

## 2016-02-14 NOTE — Telephone Encounter (Signed)
Pt has appt at 2pm today for Bone density. Are you okay with putting orders in? Pt has appt to est on 03/01/2016

## 2016-02-14 NOTE — Telephone Encounter (Signed)
Pts appt has been rescheduled for May 10th. Bone density ordered.

## 2016-03-01 ENCOUNTER — Ambulatory Visit (INDEPENDENT_AMBULATORY_CARE_PROVIDER_SITE_OTHER): Payer: BLUE CROSS/BLUE SHIELD | Admitting: Internal Medicine

## 2016-03-01 ENCOUNTER — Encounter: Payer: Self-pay | Admitting: Internal Medicine

## 2016-03-01 ENCOUNTER — Ambulatory Visit (INDEPENDENT_AMBULATORY_CARE_PROVIDER_SITE_OTHER)
Admission: RE | Admit: 2016-03-01 | Discharge: 2016-03-01 | Disposition: A | Discharge status: Home / Self Care | Payer: BLUE CROSS/BLUE SHIELD | Source: Ambulatory Visit | Attending: Internal Medicine | Admitting: Internal Medicine

## 2016-03-01 VITALS — BP 116/82 | HR 72 | Temp 98.1°F | Resp 16 | Wt 124.0 lb

## 2016-03-01 DIAGNOSIS — G629 Polyneuropathy, unspecified: Secondary | ICD-10-CM

## 2016-03-01 DIAGNOSIS — G6289 Other specified polyneuropathies: Secondary | ICD-10-CM

## 2016-03-01 DIAGNOSIS — E039 Hypothyroidism, unspecified: Secondary | ICD-10-CM

## 2016-03-01 DIAGNOSIS — M8580 Other specified disorders of bone density and structure, unspecified site: Secondary | ICD-10-CM

## 2016-03-01 MED ORDER — LEVOTHYROXINE SODIUM 75 MCG PO TABS: 75.0000 ug | ORAL_TABLET | Freq: Every day | ORAL | Status: DC

## 2016-03-01 NOTE — Progress Notes (Signed)
Pre visit review using our clinic review tool, if applicable. No additional management support is needed unless otherwise documented below in the visit note. 

## 2016-03-01 NOTE — Patient Instructions (Signed)
  All other Health Maintenance issues reviewed.   All recommended immunizations and age-appropriate screenings are up-to-date or discussed.  No immunizations administered today.   Medications reviewed and updated.  No changes recommended at this time.  Your prescription(s) have been submitted to your pharmacy. Please take as directed and contact our office if you believe you are having problem(s) with the medication(s).   Supplements you can try for neuropathy include:  Acetyl - L- Carnitine, alpha lipoic acid, Co Q10, magnesium, evening primrose oil, gamma linolenic acid

## 2016-03-01 NOTE — Progress Notes (Signed)
Subjective:    Patient ID: Sharon Stephenson, female    DOB: 10/12/1961, 55 y.o.   MRN: [id number]  HPI She is here to establish with a new pcp.    Neuropathy:  She is following with neurology.  She has a dull ache and numbness/tingling.  The longer she walks or the longer she sits the more uncomfortable it gets.  She is taking gabapentin and it helps.  She also takes magnesium and alpha lipoic acid, which she thinks helps.    Hypothyroidism:  She is taking her medication daily.  She denies any recent changes in energy or weight that are unexplained.   Osteopenia:  She is gardening and dong housework, but no other exercise.  She is planning on starting some weights and water exercise.  She is taking her calcium and vitamin d daily.    Medications and allergies reviewed with patient and updated if appropriate.  Patient Active Problem List   Diagnosis Date Noted  . Small fiber polyneuropathy (HCC) 11/23/2014  . Osteopenia 01/05/2014  . Lumbago without sciatica 02/14/2012  . Sciatica 04/20/2011  . CERVICALGIA 10/10/2010  . ANEMIA, OTHER UNSPEC 08/06/2009  . Anxiety state 06/20/2007  . Hypothyroidism 06/17/2007  . DEPRESSION 06/17/2007    Current Outpatient Prescriptions on File Prior to Visit  Medication Sig Dispense Refill  . ALPHA LIPOIC ACID PO Take by mouth.    . Ascorbic Acid (VITAMIN C) 1000 MG tablet Take 1,000 mg by mouth daily.    Marland Kitchen. BEPREVE 1.5 % SOLN 1 drop as needed. Allergy seasons    . Calcium-Magnesium-Vitamin D (CALCIUM MAGNESIUM PO) Take 1,000 mg by mouth.    . Cholecalciferol (VITAMIN D3) 1000 UNITS CAPS Take by mouth daily.      . Evening Primrose Oil 500 MG CAPS Take 1 capsule by mouth daily.    . Glucosamine-Chondroitin-Vit D3 1500-1200-800 MG-MG-UNIT PACK Take by mouth daily.      Marland Kitchen. levothyroxine (SYNTHROID, LEVOTHROID) 75 MCG tablet Take 1 tablet (75 mcg total) by mouth daily before breakfast. 90 tablet 2  . Magnesium 200 MG TABS Take by mouth daily.      .  Melatonin 1 MG TABS Take 0.5 mg by mouth at bedtime as needed.     . NON FORMULARY Zincl supplement ovc as needed    . NON FORMULARY Iron supplement take once a day    . Probiotic Product (PROBIOTIC FORMULA) CAPS Take by mouth daily.      . Turmeric 500 MG CAPS Take by mouth daily.      . vitamin B-12 (CYANOCOBALAMIN) 1000 MCG tablet Take 1,000 mcg by mouth daily.     No current facility-administered medications on file prior to visit.    Past Medical History  Diagnosis Date  . ANEMIA, OTHER UNSPEC   . ANXIETY   . DEPRESSION   . HYPOTHYROIDISM   . Cervicalgia     chronic - follows with neuro in [place]  . Lumbar radicular syndrome spring 2013  . Osteopenia 01/05/2014    DEXA @ [facility] 12/2013: -1.2  . Small fiber polyneuropathy (HCC) 11/2014    Dr. Asa LenteElliot Lewitt    Past Surgical History  Procedure Laterality Date  . Tonsillectomy    . Breast surgery      Breast reduction    Social History   Social History  . Marital Status: Married    Spouse Name: N/A  . Number of Children: N/A  . Years of Education: N/A   Social  History Main Topics  . Smoking status: Former Smoker    Quit date: 10/23/1985  . Smokeless tobacco: None  . Alcohol Use: 0.0 oz/week    0 Standard drinks or equivalent per week  . Drug Use: No  . Sexual Activity: Not Asked   Other Topics Concern  . None   Social History Narrative   Married, lives with spouse; no children. Originally from [place]    Family History  Problem Relation Age of Onset  . Kidney disease Father   . Colon cancer Maternal Grandmother     Review of Systems  Constitutional: Negative for fever and chills.  Respiratory: Negative for shortness of breath and wheezing.   Cardiovascular: Negative for chest pain, palpitations and leg swelling.  Gastrointestinal: Negative for abdominal pain.       Rare gerd  Neurological: Positive for numbness and headaches (occ sinus headaches). Negative for light-headedness.       Objective:   Filed  Vitals:   03/01/16 1339  BP: 116/82  Pulse: 72  Temp: 98.1 F (36.7 C)  Resp: 16   Filed Weights   03/01/16 1339  Weight: 124 lb (56.246 kg)   Body mass index is 22.67 kg/(m^2).   Physical Exam Constitutional: Appears well-developed and well-nourished. No distress.  Neck: Neck supple. No tracheal deviation present. No thyromegaly present.  No carotid bruit. No cervical adenopathy.   Cardiovascular: Normal rate, regular rhythm and normal heart sounds.   No murmur heard.  No edema Pulmonary/Chest: Effort normal and breath sounds normal. No respiratory distress. No wheezes.       Assessment & Plan:   See Problem List for Assessment and Plan of chronic medical problems.  Follow-up annually for physical exam

## 2016-03-01 NOTE — Assessment & Plan Note (Addendum)
Last tsh in normal range Continue current dose of medication Recheck TSH with physical blood work in the fall

## 2016-03-01 NOTE — Assessment & Plan Note (Addendum)
dexa scheduled for today Taking calcium and vitamin D Stressed the importance of weightbearing activity-she will try to increase

## 2016-03-01 NOTE — Assessment & Plan Note (Signed)
Gabapentin does help some Taking supplements Guarding, housework-plans on doing water exercise. Will look into yoga Following with neurology

## 2016-03-04 ENCOUNTER — Encounter: Payer: Self-pay | Admitting: Internal Medicine

## 2016-03-16 DIAGNOSIS — G609 Hereditary and idiopathic neuropathy, unspecified: Secondary | ICD-10-CM | POA: Diagnosis not present

## 2016-03-16 DIAGNOSIS — D2372 Other benign neoplasm of skin of left lower limb, including hip: Secondary | ICD-10-CM | POA: Diagnosis not present

## 2016-03-16 DIAGNOSIS — M216X2 Other acquired deformities of left foot: Secondary | ICD-10-CM | POA: Diagnosis not present

## 2016-03-21 ENCOUNTER — Other Ambulatory Visit: Payer: Self-pay | Admitting: Internal Medicine

## 2016-05-24 DIAGNOSIS — M216X2 Other acquired deformities of left foot: Secondary | ICD-10-CM | POA: Diagnosis not present

## 2016-05-24 DIAGNOSIS — D2372 Other benign neoplasm of skin of left lower limb, including hip: Secondary | ICD-10-CM | POA: Diagnosis not present

## 2016-05-24 DIAGNOSIS — G609 Hereditary and idiopathic neuropathy, unspecified: Secondary | ICD-10-CM | POA: Diagnosis not present

## 2016-05-25 DIAGNOSIS — M791 Myalgia: Secondary | ICD-10-CM | POA: Diagnosis not present

## 2016-05-25 DIAGNOSIS — M47817 Spondylosis without myelopathy or radiculopathy, lumbosacral region: Secondary | ICD-10-CM | POA: Diagnosis not present

## 2016-05-25 DIAGNOSIS — M47812 Spondylosis without myelopathy or radiculopathy, cervical region: Secondary | ICD-10-CM | POA: Diagnosis not present

## 2016-05-25 DIAGNOSIS — M47816 Spondylosis without myelopathy or radiculopathy, lumbar region: Secondary | ICD-10-CM | POA: Diagnosis not present

## 2016-05-26 ENCOUNTER — Other Ambulatory Visit (INDEPENDENT_AMBULATORY_CARE_PROVIDER_SITE_OTHER): Payer: BLUE CROSS/BLUE SHIELD

## 2016-05-26 ENCOUNTER — Ambulatory Visit (INDEPENDENT_AMBULATORY_CARE_PROVIDER_SITE_OTHER): Payer: BLUE CROSS/BLUE SHIELD | Admitting: Family

## 2016-05-26 ENCOUNTER — Encounter: Payer: Self-pay | Admitting: Family

## 2016-05-26 VITALS — BP 104/68 | HR 68 | Temp 98.4°F | Resp 16 | Ht 62.0 in | Wt 114.0 lb

## 2016-05-26 DIAGNOSIS — R1013 Epigastric pain: Secondary | ICD-10-CM

## 2016-05-26 LAB — LIPASE: LIPASE: 21 U/L (ref 11.0–59.0)

## 2016-05-26 NOTE — Progress Notes (Signed)
Subjective:    Patient ID: Sharon Stephenson, female    DOB: 1961/02/13, 55 y.o.   MRN: [id number]  Chief Complaint  Patient presents with  . Stomach issues    has been having stomach issues, wants to make sure she is taking the right medications, started with taking a supplement that was recommended by a dr. more frequent urination    HPI:  Sharon Stephenson is a 55 y.o. female who  has a past medical history of ANEMIA, OTHER UNSPEC; ANXIETY; Cervicalgia; DEPRESSION; HYPOTHYROIDISM; Lumbar radicular syndrome (spring 2013); Osteopenia (01/05/2014); and Small fiber polyneuropathy (HCC) (11/2014). and presents today for an a follow up office visit.   1.) Stomach issues - Associated symptom of pain located in her epigastric region has been going on for about 2 weeks which started after she took a new supplement, although she is not sure that it is from the supplement. Modifying factors include OTC antacids which seemed to help some. Notes that that burning and nausea has improved however the pain has remained. Aggravated when her stomach is empty and improved with food intake. She has lost 10 pounds in the past 2 months.   Wt Readings from Last 3 Encounters:  05/26/16 114 lb (51.7 kg)  03/01/16 124 lb (56.2 kg)  09/22/15 124 lb 4 oz (56.4 kg)    Allergies  Allergen Reactions  . Nitrofurantoin   . Penicillins Other (See Comments)    Per pt     Current Outpatient Prescriptions on File Prior to Visit  Medication Sig Dispense Refill  . ALPHA LIPOIC ACID PO Take by mouth.    . Ascorbic Acid (VITAMIN C) 1000 MG tablet Take 1,000 mg by mouth daily.    Marland Kitchen BEPREVE 1.5 % SOLN 1 drop as needed. Allergy seasons    . Calcium-Magnesium-Vitamin D (CALCIUM MAGNESIUM PO) Take 1,000 mg by mouth.    . Cholecalciferol (VITAMIN D3) 1000 UNITS CAPS Take by mouth daily.      . Evening Primrose Oil 500 MG CAPS Take 1 capsule by mouth daily.    . Fish Oil-Cholecalciferol (OMEGA-3 FISH OIL-VITAMIN D3) 1200-1000 MG-UNIT  CAPS Take by mouth daily.    Marland Kitchen gabapentin (NEURONTIN) 300 MG capsule Take 300 mg by mouth 2 (two) times daily. 1-2 daily as needed    . Glucosamine-Chondroitin-Vit D3 1500-1200-800 MG-MG-UNIT PACK Take by mouth daily.      Marland Kitchen levothyroxine (SYNTHROID, LEVOTHROID) 75 MCG tablet Take 1 tablet (75 mcg total) by mouth daily before breakfast. 90 tablet 2  . levothyroxine (SYNTHROID, LEVOTHROID) 75 MCG tablet TAKE 1 TABLET (75 MCG TOTAL) BY MOUTH DAILY BEFORE BREAKFAST. 90 tablet 3  . Magnesium 200 MG TABS Take by mouth daily.      . Melatonin 1 MG TABS Take 0.5 mg by mouth at bedtime as needed.     . NON FORMULARY Zincl supplement ovc as needed    . NON FORMULARY Iron supplement take once a day    . Probiotic Product (PROBIOTIC FORMULA) CAPS Take by mouth daily.      . Turmeric 500 MG CAPS Take by mouth daily.      . vitamin B-12 (CYANOCOBALAMIN) 1000 MCG tablet Take 1,000 mcg by mouth daily.     No current facility-administered medications on file prior to visit.     Past Medical History:  Diagnosis Date  . ANEMIA, OTHER UNSPEC   . ANXIETY   . Cervicalgia    chronic - follows with neuro in [place]  .  DEPRESSION   . HYPOTHYROIDISM   . Lumbar radicular syndrome spring 2013  . Osteopenia 01/05/2014   DEXA @ [facility] 12/2013: -1.2  . Small fiber polyneuropathy (HCC) 11/2014   Dr. Asa Lente    Review of Systems  Constitutional: Negative for chills and fever.  Respiratory: Negative for chest tightness, shortness of breath and wheezing.   Cardiovascular: Negative for chest pain, palpitations and leg swelling.  Gastrointestinal: Positive for abdominal pain. Negative for blood in stool, constipation, diarrhea, rectal pain and vomiting.      Objective:    BP 104/68 (BP Location: Left Arm, Patient Position: Sitting, Cuff Size: Normal)   Pulse 68   Temp 98.4 F (36.9 C) (Oral)   Resp 16   Ht  (1.575 m)   Wt 114 lb (51.7 kg)   SpO2 98%   BMI 20.85 kg/m  Nursing note and vital signs  reviewed.  Physical Exam  Constitutional: She is oriented to person, place, and time. She appears well-developed and well-nourished. No distress.  Cardiovascular: Normal rate, regular rhythm, normal heart sounds and intact distal pulses.   Pulmonary/Chest: Effort normal and breath sounds normal.  Abdominal: Normal appearance and bowel sounds are normal. She exhibits no mass. There is no hepatosplenomegaly. There is no tenderness. There is no rigidity, no rebound, no guarding, no tenderness at McBurney's point and negative Murphy's sign.  Neurological: She is alert and oriented to person, place, and time.  Skin: Skin is warm and dry.  Psychiatric: She has a normal mood and affect. Her behavior is normal. Judgment and thought content normal.       Assessment & Plan:   Problem List Items Addressed This Visit      Other   Abdominal pain, epigastric - Primary    Epigastric pain consistent with possible reflux with concern for peptic ulcer disease given long-term nonsteroidal anti-inflammatories. Has had improvement lately. Obtain H. pylori and lipase levels. Avoid nonsteroidal anti-inflammatories. Start omeprazole. Follow-up if symptoms worsen or do not improve.      Relevant Orders   H Pylori, IGM, IGG, IGA AB   Lipase (Completed)    Other Visit Diagnoses   None.      I am having Ms. Tweed maintain her Glucosamine-Chondroitin-Vit D3, Melatonin, PROBIOTIC FORMULA, Vitamin D3, Turmeric, Magnesium, BEPREVE, NON FORMULARY, Evening Primrose Oil, NON FORMULARY, vitamin C, Calcium-Magnesium-Vitamin D (CALCIUM MAGNESIUM PO), vitamin B-12, ALPHA LIPOIC ACID PO, Omega-3 Fish Oil-Vitamin D3, gabapentin, levothyroxine, levothyroxine, methocarbamol, and omeprazole.   Meds ordered this encounter  Medications  . methocarbamol (ROBAXIN) 500 MG tablet    Sig: Take 500 mg by mouth 4 (four) times daily.  Marland Kitchen omeprazole (PRILOSEC) 20 MG capsule    Sig: Take 20 mg by mouth daily.     Follow-up:  Return if symptoms worsen or fail to improve.  Jeanine Luz, FNP

## 2016-05-26 NOTE — Patient Instructions (Signed)
Thank you for choosing [facility].  Summary/Instructions:  Please avoid Nonsteroidal Anti-inflammatories such as Aleve and ibuprofen.  Acetaminophen or Tylenol as needed for pain.  Start omeprazole 40 mg daily. This is equivalent to 2 of the over-the-counter tablets which she had purchased.   Please stop by the lab on the lower level of the building for your blood work. Your results will be released to MyChart (or called to you) after review, usually within 72 hours after test completion. If any changes need to be made, you will be notified at that same time.  1. The lab is open from 7:30am to 5:30 pm Monday-Friday  2. No appointment is necessary  3. Fasting (if needed) is 6-8 hours after food and drink; black coffee and water  are okay   If your symptoms worsen or fail to improve, please contact our office for further instruction, or in case of emergency go directly to the emergency room at the closest medical facility.    Food Choices for Gastroesophageal Reflux Disease, Adult When you have gastroesophageal reflux disease (GERD), the foods you eat and your eating habits are very important. Choosing the right foods can help ease the discomfort of GERD. WHAT GENERAL GUIDELINES DO I NEED TO FOLLOW?  Choose fruits, vegetables, whole grains, low-fat dairy products, and low-fat meat, fish, and poultry.  Limit fats such as oils, salad dressings, butter, nuts, and avocado.  Keep a food diary to identify foods that cause symptoms.  Avoid foods that cause reflux. These may be different for different people.  Eat frequent small meals instead of three large meals each day.  Eat your meals slowly, in a relaxed setting.  Limit fried foods.  Cook foods using methods other than frying.  Avoid drinking alcohol.  Avoid drinking large amounts of liquids with your meals.  Avoid bending over or lying down until 2-3 hours after eating. WHAT FOODS ARE NOT RECOMMENDED? The following  are some foods and drinks that may worsen your symptoms: Vegetables Tomatoes. Tomato juice. Tomato and spaghetti sauce. Chili peppers. Onion and garlic. Horseradish. Fruits Oranges, grapefruit, and lemon (fruit and juice). Meats High-fat meats, fish, and poultry. This includes hot dogs, ribs, ham, sausage, salami, and bacon. Dairy Whole milk and chocolate milk. Sour cream. Cream. Butter. Ice cream. Cream cheese.  Beverages Coffee and tea, with or without caffeine. Carbonated beverages or energy drinks. Condiments Hot sauce. Barbecue sauce.  Sweets/Desserts Chocolate and cocoa. Donuts. Peppermint and spearmint. Fats and Oils High-fat foods, including [place] fries and potato chips. Other Vinegar. Strong spices, such as black pepper, white pepper, red pepper, cayenne, curry powder, cloves, ginger, and chili powder. The items listed above may not be a complete list of foods and beverages to avoid. Contact your dietitian for more information.   This information is not intended to replace advice given to you by your health care provider. Make sure you discuss any questions you have with your health care provider.   Document Released: 10/09/2005 Document Revised: 10/30/2014 Document Reviewed: 08/13/2013 Elsevier Interactive Patient Education [employer]

## 2016-05-26 NOTE — Assessment & Plan Note (Signed)
Epigastric pain consistent with possible reflux with concern for peptic ulcer disease given long-term nonsteroidal anti-inflammatories. Has had improvement lately. Obtain H. pylori and lipase levels. Avoid nonsteroidal anti-inflammatories. Start omeprazole. Follow-up if symptoms worsen or do not improve.

## 2016-05-30 ENCOUNTER — Encounter: Payer: Self-pay | Admitting: Family

## 2016-05-30 LAB — H PYLORI, IGM, IGG, IGA AB: H Pylori IgG: <0.9 U/mL (ref 0.0–0.8)

## 2016-05-31 DIAGNOSIS — G629 Polyneuropathy, unspecified: Secondary | ICD-10-CM | POA: Diagnosis not present

## 2016-05-31 DIAGNOSIS — G603 Idiopathic progressive neuropathy: Secondary | ICD-10-CM | POA: Diagnosis not present

## 2016-06-05 DIAGNOSIS — E038 Other specified hypothyroidism: Secondary | ICD-10-CM | POA: Diagnosis not present

## 2016-06-05 DIAGNOSIS — F330 Major depressive disorder, recurrent, mild: Secondary | ICD-10-CM | POA: Diagnosis not present

## 2016-06-05 DIAGNOSIS — G64 Other disorders of peripheral nervous system: Secondary | ICD-10-CM | POA: Diagnosis not present

## 2016-06-06 DIAGNOSIS — H5213 Myopia, bilateral: Secondary | ICD-10-CM | POA: Diagnosis not present

## 2016-06-06 DIAGNOSIS — H52223 Regular astigmatism, bilateral: Secondary | ICD-10-CM | POA: Diagnosis not present

## 2016-06-06 DIAGNOSIS — H524 Presbyopia: Secondary | ICD-10-CM | POA: Diagnosis not present

## 2016-06-21 DIAGNOSIS — M79671 Pain in right foot: Secondary | ICD-10-CM | POA: Diagnosis not present

## 2016-06-21 DIAGNOSIS — M79672 Pain in left foot: Secondary | ICD-10-CM | POA: Diagnosis not present

## 2016-06-21 DIAGNOSIS — G603 Idiopathic progressive neuropathy: Secondary | ICD-10-CM | POA: Diagnosis not present

## 2016-08-09 DIAGNOSIS — M79672 Pain in left foot: Secondary | ICD-10-CM | POA: Diagnosis not present

## 2016-08-09 DIAGNOSIS — M79671 Pain in right foot: Secondary | ICD-10-CM | POA: Diagnosis not present

## 2016-08-16 DIAGNOSIS — M79671 Pain in right foot: Secondary | ICD-10-CM | POA: Diagnosis not present

## 2016-08-16 DIAGNOSIS — M79672 Pain in left foot: Secondary | ICD-10-CM | POA: Diagnosis not present

## 2016-08-18 LAB — HM MAMMOGRAPHY

## 2016-08-19 DIAGNOSIS — Z23 Encounter for immunization: Secondary | ICD-10-CM | POA: Diagnosis not present

## 2016-08-21 DIAGNOSIS — K219 Gastro-esophageal reflux disease without esophagitis: Secondary | ICD-10-CM | POA: Diagnosis not present

## 2016-08-21 DIAGNOSIS — G64 Other disorders of peripheral nervous system: Secondary | ICD-10-CM | POA: Diagnosis not present

## 2016-08-21 DIAGNOSIS — R339 Retention of urine, unspecified: Secondary | ICD-10-CM | POA: Diagnosis not present

## 2016-08-21 DIAGNOSIS — F330 Major depressive disorder, recurrent, mild: Secondary | ICD-10-CM | POA: Diagnosis not present

## 2016-08-22 DIAGNOSIS — M79671 Pain in right foot: Secondary | ICD-10-CM | POA: Diagnosis not present

## 2016-08-22 DIAGNOSIS — M79672 Pain in left foot: Secondary | ICD-10-CM | POA: Diagnosis not present

## 2016-09-01 DIAGNOSIS — M79672 Pain in left foot: Secondary | ICD-10-CM | POA: Diagnosis not present

## 2016-09-01 DIAGNOSIS — M79671 Pain in right foot: Secondary | ICD-10-CM | POA: Diagnosis not present

## 2016-09-07 DIAGNOSIS — M79671 Pain in right foot: Secondary | ICD-10-CM | POA: Diagnosis not present

## 2016-09-07 DIAGNOSIS — M79672 Pain in left foot: Secondary | ICD-10-CM | POA: Diagnosis not present

## 2016-09-12 DIAGNOSIS — M79672 Pain in left foot: Secondary | ICD-10-CM | POA: Diagnosis not present

## 2016-09-12 DIAGNOSIS — M79671 Pain in right foot: Secondary | ICD-10-CM | POA: Diagnosis not present

## 2016-09-18 DIAGNOSIS — Z1231 Encounter for screening mammogram for malignant neoplasm of breast: Secondary | ICD-10-CM | POA: Diagnosis not present

## 2016-09-18 DIAGNOSIS — Z01419 Encounter for gynecological examination (general) (routine) without abnormal findings: Secondary | ICD-10-CM | POA: Diagnosis not present

## 2016-09-18 DIAGNOSIS — Z6831 Body mass index (BMI) 31.0-31.9, adult: Secondary | ICD-10-CM | POA: Diagnosis not present

## 2016-09-19 DIAGNOSIS — M79672 Pain in left foot: Secondary | ICD-10-CM | POA: Diagnosis not present

## 2016-09-19 DIAGNOSIS — M79671 Pain in right foot: Secondary | ICD-10-CM | POA: Diagnosis not present

## 2016-09-20 DIAGNOSIS — M79671 Pain in right foot: Secondary | ICD-10-CM | POA: Diagnosis not present

## 2016-09-20 DIAGNOSIS — M79672 Pain in left foot: Secondary | ICD-10-CM | POA: Diagnosis not present

## 2016-09-21 ENCOUNTER — Other Ambulatory Visit: Payer: Self-pay | Admitting: Obstetrics and Gynecology

## 2016-09-21 DIAGNOSIS — R928 Other abnormal and inconclusive findings on diagnostic imaging of breast: Secondary | ICD-10-CM

## 2016-09-27 ENCOUNTER — Ambulatory Visit
Admission: RE | Admit: 2016-09-27 | Discharge: 2016-09-27 | Disposition: A | Discharge status: Home / Self Care | Payer: BLUE CROSS/BLUE SHIELD | Source: Ambulatory Visit | Attending: Obstetrics and Gynecology | Admitting: Obstetrics and Gynecology

## 2016-09-27 DIAGNOSIS — R928 Other abnormal and inconclusive findings on diagnostic imaging of breast: Secondary | ICD-10-CM

## 2016-09-27 DIAGNOSIS — R922 Inconclusive mammogram: Secondary | ICD-10-CM | POA: Diagnosis not present

## 2016-10-02 DIAGNOSIS — M79672 Pain in left foot: Secondary | ICD-10-CM | POA: Diagnosis not present

## 2016-10-02 DIAGNOSIS — M79671 Pain in right foot: Secondary | ICD-10-CM | POA: Diagnosis not present

## 2016-10-09 DIAGNOSIS — M79671 Pain in right foot: Secondary | ICD-10-CM | POA: Diagnosis not present

## 2016-10-09 DIAGNOSIS — M79672 Pain in left foot: Secondary | ICD-10-CM | POA: Diagnosis not present

## 2016-10-11 DIAGNOSIS — M79672 Pain in left foot: Secondary | ICD-10-CM | POA: Diagnosis not present

## 2016-10-11 DIAGNOSIS — M79671 Pain in right foot: Secondary | ICD-10-CM | POA: Diagnosis not present

## 2016-10-18 DIAGNOSIS — M79671 Pain in right foot: Secondary | ICD-10-CM | POA: Diagnosis not present

## 2016-10-18 DIAGNOSIS — M79672 Pain in left foot: Secondary | ICD-10-CM | POA: Diagnosis not present

## 2016-10-19 DIAGNOSIS — M47816 Spondylosis without myelopathy or radiculopathy, lumbar region: Secondary | ICD-10-CM | POA: Diagnosis not present

## 2016-10-19 DIAGNOSIS — M47812 Spondylosis without myelopathy or radiculopathy, cervical region: Secondary | ICD-10-CM | POA: Diagnosis not present

## 2016-10-30 DIAGNOSIS — M47812 Spondylosis without myelopathy or radiculopathy, cervical region: Secondary | ICD-10-CM | POA: Diagnosis not present

## 2016-11-06 ENCOUNTER — Ambulatory Visit (INDEPENDENT_AMBULATORY_CARE_PROVIDER_SITE_OTHER): Payer: BLUE CROSS/BLUE SHIELD | Admitting: Gastroenterology

## 2016-11-06 ENCOUNTER — Encounter: Payer: Self-pay | Admitting: Gastroenterology

## 2016-11-06 VITALS — BP 94/58 | HR 80 | Ht 61.0 in | Wt 118.0 lb

## 2016-11-06 DIAGNOSIS — G8929 Other chronic pain: Secondary | ICD-10-CM | POA: Diagnosis not present

## 2016-11-06 DIAGNOSIS — R1013 Epigastric pain: Secondary | ICD-10-CM | POA: Diagnosis not present

## 2016-11-06 MED ORDER — SUCRALFATE 1 G PO TABS: 1.0000 g | ORAL_TABLET | Freq: Four times a day (QID) | ORAL | 1 refills | Status: DC

## 2016-11-06 NOTE — Progress Notes (Signed)
11/06/2016 Sharon Stephenson [id number] 1961-09-02   HISTORY OF PRESENT ILLNESS:  This is a pleasant 56 year old female who is previously known to Dr. Arlyce Dice for colonoscopy in November 2010 at which time she had a normal study. Her care will be assumed by Dr. Lavon Paganini in Dr. Marzetta Board absence. She is here today at the request of her PCP, Dr. Link Snuffer, for evaluation of epigastric abdominal pain/burning.  She says that this has been present since about August. She admits that she had been taking Aleve and some type of supplement for her small fiber polyneuropathy. She is wondering if that caused her symptoms. She has now discontinued both of these since around the time that her symptoms began, but still continues to have epigastric burning. She says it seems to be worse if her stomach is empty. She did try Prilosec OTC for a while but it did not allow any improvement in her symptoms. Now she is on Nexium 40 mg daily since the end of October and still having epigastric burning. She denies any actual sensation of reflux. She tells me that she did initially lose 14 pounds from all of this but recently she has gained 4 or 5 pounds back.  Lipase was normal in August and Hpylori serologies were negative.  Past Medical History:  Diagnosis Date  . ANEMIA, OTHER UNSPEC   . ANXIETY   . Cervicalgia    chronic - follows with neuro in [place]  . DEPRESSION   . HYPOTHYROIDISM   . Lumbar radicular syndrome spring 2013  . Osteopenia 01/05/2014   DEXA @ [facility] 12/2013: -1.2  . Small fiber polyneuropathy (HCC) 11/2014   Dr. Asa Lente   Past Surgical History:  Procedure Laterality Date  . BREAST SURGERY     Breast reduction  . TONSILLECTOMY      reports that she quit smoking about 31 years ago. She does not have any smokeless tobacco history on file. She reports that she drinks alcohol. She reports that she does not use drugs. family history includes Colon cancer in her paternal grandmother; Kidney disease in her  father. Allergies  Allergen Reactions  . Nitrofurantoin   . Penicillins Other (See Comments)    Per pt      Outpatient Encounter Prescriptions as of 11/06/2016  Medication Sig  . ALPHA LIPOIC ACID PO Take by mouth.  . Biotin 5 MG CAPS Take by mouth.  . Calcium-Magnesium-Vitamin D (CALCIUM MAGNESIUM PO) Take 1,000 mg by mouth.  . Cholecalciferol (VITAMIN D3) 1000 UNITS CAPS Take by mouth daily.    . Coenzyme Q10 (COQ10) 100 MG CAPS Take by mouth.  . esomeprazole (NEXIUM) 40 MG capsule Take 40 mg by mouth every morning.  . Evening Primrose Oil 500 MG CAPS Take 1 capsule by mouth daily.  . Fish Oil-Cholecalciferol (OMEGA-3 FISH OIL-VITAMIN D3) 1200-1000 MG-UNIT CAPS Take by mouth daily.  Marland Kitchen gabapentin (NEURONTIN) 300 MG capsule Take 300 mg by mouth 2 (two) times daily. 1-2 daily as needed  . levothyroxine (SYNTHROID, LEVOTHROID) 75 MCG tablet Take 1 tablet (75 mcg total) by mouth daily before breakfast.  . levothyroxine (SYNTHROID, LEVOTHROID) 75 MCG tablet TAKE 1 TABLET (75 MCG TOTAL) BY MOUTH DAILY BEFORE BREAKFAST.  . Melatonin 1 MG TABS Take 0.5 mg by mouth at bedtime as needed.   . NON FORMULARY Zincl supplement ovc as needed  . NON FORMULARY Iron supplement take once a day  . Probiotic Product (PROBIOTIC FORMULA) CAPS Take by mouth daily.    Marland Kitchen  Turmeric 500 MG CAPS Take by mouth daily.    . vitamin B-12 (CYANOCOBALAMIN) 1000 MCG tablet Take 1,000 mcg by mouth daily.  . Magnesium 200 MG TABS Take by mouth daily.    . sucralfate (CARAFATE) 1 g tablet Take 1 tablet (1 g total) by mouth 4 (four) times daily. Take four times daily achs  . [DISCONTINUED] Ascorbic Acid (VITAMIN C) 1000 MG tablet Take 1,000 mg by mouth daily.  . [DISCONTINUED] BEPREVE 1.5 % SOLN 1 drop as needed. Allergy seasons  . [DISCONTINUED] Glucosamine-Chondroitin-Vit D3 1500-1200-800 MG-MG-UNIT PACK Take by mouth daily.    . [DISCONTINUED] methocarbamol (ROBAXIN) 500 MG tablet Take 500 mg by mouth 4 (four) times  daily.  . [DISCONTINUED] omeprazole (PRILOSEC) 20 MG capsule Take 20 mg by mouth daily.   No facility-administered encounter medications on file as of 11/06/2016.      REVIEW OF SYSTEMS  : All other systems reviewed and negative except where noted in the History of Present Illness.   PHYSICAL EXAM: BP (!) 94/58 (BP Location: Left Arm, Patient Position: Sitting, Cuff Size: Normal)   Pulse 80   Ht 5\' 1"  (1.549 m)   Wt 118 lb (53.5 kg)   BMI 22.30 kg/m  General: Well developed white female in no acute distress Head: Normocephalic and atraumatic Eyes:  Sclerae anicteric, conjunctiva pink. Ears: Normal auditory acuity Lungs: Clear throughout to auscultation Heart: Regular rate and rhythm Abdomen: Soft, non-distended.  Normal bowel sounds.  Non-tender. Musculoskeletal: Symmetrical with no gross deformities  Skin: No lesions on visible extremities Extremities: No edema  Neurological: Alert oriented x 4, grossly non-focal Psychological:  Alert and cooperative. Normal mood and affect  ASSESSMENT AND PLAN: -56 year old female with complaints of epigastric pain/burning for the past 4 months or so despite PPI.  Was using NSAID's but discontinued those a few months ago as well.  Will schedule for EGD with Dr. Lavon PaganiniNandigam for further evaluation to rule out ulcer, etc.  In the interim she will continue to avoid NSAID's and will continue her Nexium 40 mg daily.  I am also going to place her on carafate tablet ACHS for now to see if that helps.  The risks, benefits, and alternatives to EGD were discussed with the patient and she consents to proceed.  If EGD negative then may need other imaging such as CT scan if pain continues.  CC:  Pincus SanesBurns, Stacy J, MD

## 2016-11-06 NOTE — Patient Instructions (Signed)
If you are age 57 or older, your body mass index should be between 23-30. Your Body mass index is 22.3 kg/m. If this is out of the aforementioned range listed, please consider follow up with your Primary Care Provider.  If you are age 57 or younger, your body mass index should be between 19-25. Your Body mass index is 22.3 kg/m. If this is out of the aformentioned range listed, please consider follow up with your Primary Care Provider.   You have been scheduled for an endoscopy. Please follow written instructions given to you at your visit today. If you use inhalers (even only as needed), please bring them with you on the day of your procedure. Your physician has requested that you go to www.startemmi.com and enter the access code given to you at your visit today. This web site gives a general overview about your procedure. However, you should still follow specific instructions given to you by our office regarding your preparation for the procedure.  We have sent the following medications to your pharmacy for you to pick up at your convenience:  Carafate  Thank you.

## 2016-11-09 ENCOUNTER — Encounter: Payer: Self-pay | Admitting: Gastroenterology

## 2016-11-13 DIAGNOSIS — Z0000 Encounter for general adult medical examination without abnormal findings: Secondary | ICD-10-CM | POA: Diagnosis not present

## 2016-11-13 DIAGNOSIS — R8299 Other abnormal findings in urine: Secondary | ICD-10-CM | POA: Diagnosis not present

## 2016-11-13 DIAGNOSIS — E038 Other specified hypothyroidism: Secondary | ICD-10-CM | POA: Diagnosis not present

## 2016-11-13 NOTE — Progress Notes (Signed)
Reviewed and agree with documentation and assessment and plan. K. Veena Jerrye Seebeck , MD   

## 2016-11-14 DIAGNOSIS — M47812 Spondylosis without myelopathy or radiculopathy, cervical region: Secondary | ICD-10-CM | POA: Diagnosis not present

## 2016-11-17 DIAGNOSIS — R1013 Epigastric pain: Secondary | ICD-10-CM | POA: Diagnosis not present

## 2016-11-17 DIAGNOSIS — Z1389 Encounter for screening for other disorder: Secondary | ICD-10-CM | POA: Diagnosis not present

## 2016-11-17 DIAGNOSIS — Z0000 Encounter for general adult medical examination without abnormal findings: Secondary | ICD-10-CM | POA: Diagnosis not present

## 2016-11-17 DIAGNOSIS — M4312 Spondylolisthesis, cervical region: Secondary | ICD-10-CM | POA: Diagnosis not present

## 2016-11-17 DIAGNOSIS — G64 Other disorders of peripheral nervous system: Secondary | ICD-10-CM | POA: Diagnosis not present

## 2016-11-17 DIAGNOSIS — F330 Major depressive disorder, recurrent, mild: Secondary | ICD-10-CM | POA: Diagnosis not present

## 2016-11-21 DIAGNOSIS — M79672 Pain in left foot: Secondary | ICD-10-CM | POA: Diagnosis not present

## 2016-11-21 DIAGNOSIS — M79671 Pain in right foot: Secondary | ICD-10-CM | POA: Diagnosis not present

## 2016-11-23 ENCOUNTER — Encounter: Payer: BLUE CROSS/BLUE SHIELD | Admitting: Gastroenterology

## 2016-12-06 ENCOUNTER — Telehealth: Payer: Self-pay | Admitting: *Deleted

## 2016-12-06 NOTE — Telephone Encounter (Signed)
Called the patient to advise we got a 90 day refill request for Carafate tablets.   She said she does not need the tablets. She stiil has some and takes 1 twice daily when she needs to.  Her [facility] Care, [facility] told her to call us if she feel she needs to reschedule the EGD. She cancelled the one we scheduled for 11-23-2016 because she is feeling better.

## 2017-01-30 ENCOUNTER — Other Ambulatory Visit: Payer: BLUE CROSS/BLUE SHIELD

## 2017-02-07 DIAGNOSIS — M79672 Pain in left foot: Secondary | ICD-10-CM | POA: Diagnosis not present

## 2017-02-07 DIAGNOSIS — M79671 Pain in right foot: Secondary | ICD-10-CM | POA: Diagnosis not present

## 2017-02-26 DIAGNOSIS — M79671 Pain in right foot: Secondary | ICD-10-CM | POA: Diagnosis not present

## 2017-02-26 DIAGNOSIS — M79672 Pain in left foot: Secondary | ICD-10-CM | POA: Diagnosis not present

## 2017-03-14 DIAGNOSIS — M79671 Pain in right foot: Secondary | ICD-10-CM | POA: Diagnosis not present

## 2017-03-14 DIAGNOSIS — M79672 Pain in left foot: Secondary | ICD-10-CM | POA: Diagnosis not present

## 2017-05-25 DIAGNOSIS — M545 Low back pain: Secondary | ICD-10-CM | POA: Diagnosis not present

## 2017-05-25 DIAGNOSIS — M5126 Other intervertebral disc displacement, lumbar region: Secondary | ICD-10-CM | POA: Diagnosis not present

## 2017-05-25 DIAGNOSIS — M47816 Spondylosis without myelopathy or radiculopathy, lumbar region: Secondary | ICD-10-CM | POA: Diagnosis not present

## 2017-05-25 DIAGNOSIS — Z6822 Body mass index (BMI) 22.0-22.9, adult: Secondary | ICD-10-CM | POA: Diagnosis not present

## 2017-05-25 DIAGNOSIS — M791 Myalgia: Secondary | ICD-10-CM | POA: Diagnosis not present

## 2017-07-24 DIAGNOSIS — H04123 Dry eye syndrome of bilateral lacrimal glands: Secondary | ICD-10-CM | POA: Diagnosis not present

## 2017-07-24 DIAGNOSIS — H43811 Vitreous degeneration, right eye: Secondary | ICD-10-CM | POA: Diagnosis not present

## 2017-07-24 DIAGNOSIS — H2513 Age-related nuclear cataract, bilateral: Secondary | ICD-10-CM | POA: Diagnosis not present

## 2017-11-12 ENCOUNTER — Encounter: Payer: Self-pay | Admitting: Internal Medicine

## 2017-11-12 ENCOUNTER — Ambulatory Visit (INDEPENDENT_AMBULATORY_CARE_PROVIDER_SITE_OTHER): Payer: BLUE CROSS/BLUE SHIELD | Admitting: Internal Medicine

## 2017-11-12 VITALS — BP 110/80 | HR 78 | Temp 98.6°F | Resp 16 | Wt 119.0 lb

## 2017-11-12 DIAGNOSIS — K219 Gastro-esophageal reflux disease without esophagitis: Secondary | ICD-10-CM

## 2017-11-12 DIAGNOSIS — G629 Polyneuropathy, unspecified: Secondary | ICD-10-CM | POA: Diagnosis not present

## 2017-11-12 DIAGNOSIS — M79661 Pain in right lower leg: Secondary | ICD-10-CM | POA: Insufficient documentation

## 2017-11-12 DIAGNOSIS — G6289 Other specified polyneuropathies: Secondary | ICD-10-CM

## 2017-11-12 NOTE — Patient Instructions (Signed)
  Call and schedule the EGD.    Medications reviewed and updated. No changes recommended at this time.   A referral was ordered for vascular surgery and neurology  Please followup for a physical exam.

## 2017-11-12 NOTE — Assessment & Plan Note (Signed)
Symptoms have gotten worse Burning, numbness/tingling in hands and feet Neurologist retiring - will refer to neuro to get established Continue gabapentin Will start light therapy

## 2017-11-12 NOTE — Assessment & Plan Note (Addendum)
Having gerd symptoms for several months Stopped several foods and nsaids nexium x 3 months did not help carafate did not help Will call GI to discuss EGD She is taking a lot supplements and I am suspicious that one of those if not more than one of those is causing her symptoms Stop supplements for now

## 2017-11-12 NOTE — Assessment & Plan Note (Signed)
Tightness in her proximal posterior right calf - palpable varicose veins which may be causing her symptoms Will refer to vascualr

## 2017-11-12 NOTE — Progress Notes (Signed)
Subjective:    Patient ID: Sharon Stephenson, female    DOB: 08/12/1961, 57 y.o.   MRN: [id number]  HPI She is here for an acute visit.    Both of her neurologists retired.  She has small fiber polyneuropathy.  She will start doing light therapy.  She is taking two gabapentin a day.    GERD:  She tried lots of vitamins and started having GI issues.  She stopped alcohol and coffee, chocolate.  She was taking aleve.  She is still having GERD.  She feels it when her sotmach is empty, stress and when she eats acidic foods.  nexium 40 mg daily x 3 months did not help.  She did see GI and was on carafate at one point and that did not help.  She deferred a EGD last year, but wonders if she should have it. She denies abdominal pain.   She does have increased stress.  She plans on starting yoga to see if that helps.    Tightness in calf:  She has tightness in her posterior right proximal lower leg.  It started several months ago - it is intermittent.  It occurs when she sits for a while.    She has tried stretching.  She denies right knee pain or swelling.    Anxiety:  She is taking CBD oil and it is helping her anxiety.  It is making sleepy and she takes it at night.    Lower back pain:  She continues to have lower back pain.   Medications and allergies reviewed with patient and updated if appropriate.  Patient Active Problem List   Diagnosis Date Noted  . Abdominal pain, chronic, epigastric 05/26/2016  . Small fiber polyneuropathy (HCC) 11/23/2014  . Osteopenia 01/05/2014  . Lumbago without sciatica 02/14/2012  . Sciatica 04/20/2011  . CERVICALGIA 10/10/2010  . ANEMIA, OTHER UNSPEC 08/06/2009  . Anxiety state 06/20/2007  . Hypothyroidism 06/17/2007  . DEPRESSION 06/17/2007    Current Outpatient Medications on File Prior to Visit  Medication Sig Dispense Refill  . ALPHA LIPOIC ACID PO Take by mouth.    . Biotin 5 MG CAPS Take by mouth.    . Calcium-Magnesium-Vitamin D (CALCIUM MAGNESIUM  PO) Take 1,000 mg by mouth.    . Cholecalciferol (VITAMIN D3) 1000 UNITS CAPS Take by mouth daily.      . Coenzyme Q10 (COQ10) 100 MG CAPS Take by mouth.    . esomeprazole (NEXIUM) 40 MG capsule Take 40 mg by mouth every morning.    . Evening Primrose Oil 500 MG CAPS Take 1 capsule by mouth daily.    . Fish Oil-Cholecalciferol (OMEGA-3 FISH OIL-VITAMIN D3) 1200-1000 MG-UNIT CAPS Take by mouth daily.    Marland Kitchen. gabapentin (NEURONTIN) 300 MG capsule Take 300 mg by mouth 2 (two) times daily. 1-2 daily as needed    . levothyroxine (SYNTHROID, LEVOTHROID) 75 MCG tablet Take 1 tablet (75 mcg total) by mouth daily before breakfast. 90 tablet 2  . levothyroxine (SYNTHROID, LEVOTHROID) 75 MCG tablet TAKE 1 TABLET (75 MCG TOTAL) BY MOUTH DAILY BEFORE BREAKFAST. 90 tablet 3  . Magnesium 200 MG TABS Take by mouth daily.      . Melatonin 1 MG TABS Take 0.5 mg by mouth at bedtime as needed.     . NON FORMULARY Zincl supplement ovc as needed    . NON FORMULARY Iron supplement take once a day    . Probiotic Product (PROBIOTIC FORMULA) CAPS Take by mouth  daily.      . sucralfate (CARAFATE) 1 g tablet Take 1 tablet (1 g total) by mouth 4 (four) times daily. Take four times daily achs 120 tablet 1  . Turmeric 500 MG CAPS Take by mouth daily.      . vitamin B-12 (CYANOCOBALAMIN) 1000 MCG tablet Take 1,000 mcg by mouth daily.     No current facility-administered medications on file prior to visit.     Past Medical History:  Diagnosis Date  . ANEMIA, OTHER UNSPEC   . ANXIETY   . Cervicalgia    chronic - follows with neuro in [place]  . DEPRESSION   . HYPOTHYROIDISM   . Lumbar radicular syndrome spring 2013  . Osteopenia 01/05/2014   DEXA @ [facility] 12/2013: -1.2  . Small fiber polyneuropathy (HCC) 11/2014   Dr. Asa Lente    Past Surgical History:  Procedure Laterality Date  . BREAST SURGERY     Breast reduction  . TONSILLECTOMY      Social History   Socioeconomic History  . Marital status: Married    Spouse  name: Not on file  . Number of children: Not on file  . Years of education: Not on file  . Highest education level: Not on file  Social Needs  . Financial resource strain: Not on file  . Food insecurity - worry: Not on file  . Food insecurity - inability: Not on file  . Transportation needs - medical: Not on file  . Transportation needs - non-medical: Not on file  Occupational History  . Not on file  Tobacco Use  . Smoking status: Former Smoker    Last attempt to quit: 10/23/1985    Years since quitting: 32.0  Substance and Sexual Activity  . Alcohol use: Yes    Alcohol/week: 0.0 oz  . Drug use: No  . Sexual activity: Not on file  Other Topics Concern  . Not on file  Social History Narrative   Married, lives with spouse; no children. Originally from [place]    Family History  Problem Relation Age of Onset  . Kidney disease Father   . Colon cancer Paternal Grandmother     Review of Systems  Constitutional: Negative for chills and fever.  Respiratory: Negative for cough, shortness of breath and wheezing.   Cardiovascular: Negative for chest pain, palpitations and leg swelling.  Musculoskeletal: Positive for back pain.  Neurological: Positive for numbness. Negative for headaches.       Objective:   Vitals:   11/12/17 1352  BP: 110/80  Pulse: 78  Resp: 16  Temp: 98.6 F (37 C)  SpO2: 99%   Filed Weights   11/12/17 1352  Weight: 119 lb (54 kg)   Body mass index is 22.48 kg/m.  Wt Readings from Last 3 Encounters:  11/12/17 119 lb (54 kg)  11/06/16 118 lb (53.5 kg)  05/26/16 114 lb (51.7 kg)     Physical Exam GENERAL APPEARANCE: Appears stated age, well appearing, NAD HEENT: [place], NT Abdomen: soft, non distended, non tender MSK, VASCULAR: area of tightenes/discomfort in right posterior calf - varicose vein that is not swollen or red, no tenderness; no knee swelling; no LE edema SKIN: warm, dry, no rash or erythema        Assessment & Plan:   See  Problem List for Assessment and Plan of chronic medical problems.

## 2017-11-15 ENCOUNTER — Encounter: Payer: Self-pay | Admitting: Neurology

## 2017-11-22 ENCOUNTER — Encounter: Payer: Self-pay | Admitting: Gastroenterology

## 2017-11-22 ENCOUNTER — Ambulatory Visit (INDEPENDENT_AMBULATORY_CARE_PROVIDER_SITE_OTHER): Payer: BLUE CROSS/BLUE SHIELD | Admitting: Gastroenterology

## 2017-11-22 VITALS — BP 100/60 | HR 70 | Ht 61.0 in | Wt 122.0 lb

## 2017-11-22 DIAGNOSIS — K219 Gastro-esophageal reflux disease without esophagitis: Secondary | ICD-10-CM | POA: Diagnosis not present

## 2017-11-22 DIAGNOSIS — R1013 Epigastric pain: Secondary | ICD-10-CM | POA: Diagnosis not present

## 2017-11-22 NOTE — Patient Instructions (Signed)

## 2017-11-22 NOTE — Progress Notes (Signed)
11/22/2017 Sharon Stephenson [id number] 12/01/1960   HISTORY OF PRESENT ILLNESS:  This is a pleasant 57 year old female who I saw about one year ago for complaints of epigastric abdominal pain.  Please see my note from 11/06/2016 for details surrounding this issue.  Anyway, she was scheduled for EGD at that time, but ended up cancelling it due to some other issues.  She was on Nexium and continued that along with some carafate for several months without relief.  She comes in today with the same complaints.  She has a feeling that some of this may be due to anxiety.  She not currently on any acid medication.  Weight is stable, actually up 4 pounds since last year.     Past Medical History:  Diagnosis Date  . ANEMIA, OTHER UNSPEC   . ANXIETY   . Cervicalgia    chronic - follows with neuro in [place]  . DEPRESSION   . HYPOTHYROIDISM   . Lumbar radicular syndrome spring 2013  . Osteopenia 01/05/2014   DEXA @ [facility] 12/2013: -1.2  . Small fiber polyneuropathy 11/2014   Dr. Asa LenteElliot Lewitt   Past Surgical History:  Procedure Laterality Date  . BREAST SURGERY     Breast reduction  . TONSILLECTOMY      reports that she quit smoking about 32 years ago. she has never used smokeless tobacco. She reports that she drinks alcohol. She reports that she does not use drugs. family history includes Colon cancer in her paternal grandmother; Kidney disease in her father. Allergies  Allergen Reactions  . Nitrofurantoin   . Penicillins Other (See Comments)    Per pt      Outpatient Encounter Medications as of 11/22/2017  Medication Sig  . ALPHA LIPOIC ACID PO Take by mouth.  . Biotin 5 MG CAPS Take by mouth.  . Calcium-Magnesium-Vitamin D (CALCIUM MAGNESIUM PO) Take 1,000 mg by mouth.  . Cholecalciferol (VITAMIN D3) 1000 UNITS CAPS Take by mouth daily.    . Coenzyme Q10 (COQ10) 100 MG CAPS Take by mouth.  . Evening Primrose Oil 500 MG CAPS Take 1 capsule by mouth daily.  . Fish Oil-Cholecalciferol (OMEGA-3  FISH OIL-VITAMIN D3) 1200-1000 MG-UNIT CAPS Take by mouth daily.  Marland Kitchen. gabapentin (NEURONTIN) 300 MG capsule Take 300 mg by mouth 2 (two) times daily. 1-2 daily as needed  . levothyroxine (SYNTHROID, LEVOTHROID) 75 MCG tablet TAKE 1 TABLET (75 MCG TOTAL) BY MOUTH DAILY BEFORE BREAKFAST.  . Magnesium 200 MG TABS Take by mouth daily.    . Melatonin 1 MG TABS Take 0.5 mg by mouth at bedtime as needed.   . NON FORMULARY Zincl supplement ovc as needed  . NON FORMULARY Iron supplement take once a day  . Probiotic Product (PROBIOTIC FORMULA) CAPS Take by mouth daily.    . sucralfate (CARAFATE) 1 g tablet Take 1 tablet (1 g total) by mouth 4 (four) times daily. Take four times daily achs  . Turmeric 500 MG CAPS Take by mouth daily.    . [DISCONTINUED] levothyroxine (SYNTHROID, LEVOTHROID) 75 MCG tablet Take 1 tablet (75 mcg total) by mouth daily before breakfast. (Patient not taking: Reported on 11/22/2017)  . [DISCONTINUED] vitamin B-12 (CYANOCOBALAMIN) 1000 MCG tablet Take 1,000 mcg by mouth daily.   No facility-administered encounter medications on file as of 11/22/2017.      REVIEW OF SYSTEMS  : All other systems reviewed and negative except where noted in the History of Present Illness.   PHYSICAL  EXAM: BP 100/60   Pulse 70   Ht 5\' 1"  (1.549 m)   Wt 122 lb (55.3 kg)   BMI 23.05 kg/m  General: Well developed female in no acute distress Head: Normocephalic and atraumatic Eyes:  Sclerae anicteric, conjunctiva pink. Ears: Normal auditory acuity Lungs: Clear throughout to auscultation; no increased WOB. Heart: Regular rate and rhythm; no M/R/G. Abdomen: Soft, non-distended.  BS present.  Mild epigastric TTP. Musculoskeletal: Symmetrical with no gross deformities  Skin: No lesions on visible extremities Extremities: No edema  Neurological: Alert oriented x 4, grossly non-focal Psychological:  Alert and cooperative. Normal mood and affect  ASSESSMENT AND PLAN: -57 year old female with  complaints of epigastric pain/burning despite PPI.  Was scheduled for an EGD when I saw her for these symptoms last year but she canceled due to other issues.  Now still with the same complaints.  Not on a PPI currently but took Nexium for several months and took carafate for some time as well without relief.  Will reschedule EGD with Dr. Lavon Paganini.  ? Due to anxiety or if possibly some of it is due to all of the supplements that she is taking.  **The risks, benefits, and alternatives to EGD were discussed with the patient and she consents to proceed.   CC:  Pincus Sanes, MD

## 2017-11-23 NOTE — Progress Notes (Signed)
Reviewed and agree with documentation and assessment and plan. K. Veena Nandigam , MD   

## 2017-11-26 ENCOUNTER — Other Ambulatory Visit: Payer: Self-pay

## 2017-11-26 ENCOUNTER — Encounter: Payer: Self-pay | Admitting: Gastroenterology

## 2017-11-26 ENCOUNTER — Ambulatory Visit (AMBULATORY_SURGERY_CENTER): Payer: BLUE CROSS/BLUE SHIELD | Admitting: Gastroenterology

## 2017-11-26 VITALS — BP 101/62 | HR 59 | Temp 98.4°F | Resp 13 | Ht 61.0 in | Wt 122.0 lb

## 2017-11-26 DIAGNOSIS — K2950 Unspecified chronic gastritis without bleeding: Secondary | ICD-10-CM | POA: Diagnosis not present

## 2017-11-26 DIAGNOSIS — K319 Disease of stomach and duodenum, unspecified: Secondary | ICD-10-CM | POA: Diagnosis not present

## 2017-11-26 DIAGNOSIS — R1013 Epigastric pain: Secondary | ICD-10-CM | POA: Diagnosis not present

## 2017-11-26 MED ORDER — SODIUM CHLORIDE 0.9 % IV SOLN: 500.0000 mL | Freq: Once | INTRAVENOUS | Status: DC

## 2017-11-26 NOTE — Op Note (Signed)
[facility] Patient Name: Sharon Stephenson Procedure Date: 11/26/2017 9:45 AM MRN: [id number] Endoscopist: Napoleon Form , MD Age: 57 Referring MD:  Date of Birth: [birth year] Gender: Female Account #: [id number] Procedure:                Upper GI endoscopy Indications:              Epigastric abdominal pain, Dyspepsia Medicines:                Monitored Anesthesia Care Procedure:                Pre-Anesthesia Assessment:                           - Prior to the procedure, a History and Physical                            was performed, and patient medications and                            allergies were reviewed. The patient's tolerance of                            previous anesthesia was also reviewed. The risks                            and benefits of the procedure and the sedation                            options and risks were discussed with the patient.                            All questions were answered, and informed consent                            was obtained. Prior Anticoagulants: The patient has                            taken no previous anticoagulant or antiplatelet                            agents. ASA Grade Assessment: II - A patient with                            mild systemic disease. After reviewing the risks                            and benefits, the patient was deemed in                            satisfactory condition to undergo the procedure.                           After obtaining informed consent, the endoscope was  passed under direct vision. Throughout the                            procedure, the patient's blood pressure, pulse, and                            oxygen saturations were monitored continuously. The                            Model GIF-[id number] [id number]) scope was introduced                            through the mouth, and advanced to the second part                            of duodenum.  The upper GI endoscopy was                            accomplished without difficulty. The patient                            tolerated the procedure well. Scope In: Scope Out: Findings:                 Esophagogastric landmarks were identified: the                            Z-line was found at 35 cm and the gastroesophageal                            junction was found at 35 cm from the incisors.                           The esophagus was normal.                           The entire examined stomach was normal. Biopsies                            were taken with a cold forceps for Helicobacter                            pylori testing.                           Diffuse mildly scalloped mucosa was found in the                            second portion of the duodenum. Biopsies for                            histology were taken with a cold forceps for                            evaluation of celiac disease.  The exam was otherwise without abnormality. Complications:            No immediate complications. Estimated Blood Loss:     Estimated blood loss was minimal. Impression:               - Esophagogastric landmarks identified.                           - Normal esophagus.                           - Normal stomach. Biopsied.                           - Scalloped mucosa was found in the duodenum, rule                            out celiac disease. Biopsied.                           - The examination was otherwise normal. Recommendation:           - Patient has a contact number available for                            emergencies. The signs and symptoms of potential                            delayed complications were discussed with the                            patient. Return to normal activities tomorrow.                            Written discharge instructions were provided to the                            patient.                           - Resume  previous diet.                           - Continue present medications.                           - Await pathology results. Napoleon Form, MD 11/26/2017 10:02:13 AM This report has been signed electronically.

## 2017-11-26 NOTE — Progress Notes (Signed)
Report given to PACU, vss 

## 2017-11-26 NOTE — Progress Notes (Signed)
Called to room to assist during endoscopic procedure.  Patient ID and intended procedure confirmed with present staff. Received instructions for my participation in the procedure from the performing physician.  

## 2017-11-26 NOTE — Patient Instructions (Signed)
YOU HAD AN ENDOSCOPIC PROCEDURE TODAY AT THE [facility]:   Refer to the procedure report that was given to you for any specific questions about what was found during the examination.  If the procedure report does not answer your questions, please call your gastroenterologist to clarify.  If you requested that your care partner not be given the details of your procedure findings, then the procedure report has been included in a sealed envelope for you to review at your convenience later.  YOU SHOULD EXPECT: Some feelings of bloating in the abdomen. Passage of more gas than usual.  Walking can help get rid of the air that was put into your GI tract during the procedure and reduce the bloating. If you had a lower endoscopy (such as a colonoscopy or flexible sigmoidoscopy) you may notice spotting of blood in your stool or on the toilet paper. If you underwent a bowel prep for your procedure, you may not have a normal bowel movement for a few days.  Please Note:  You might notice some irritation and congestion in your nose or some drainage.  This is from the oxygen used during your procedure.  There is no need for concern and it should clear up in a day or so.  SYMPTOMS TO REPORT IMMEDIATELY:   Following upper endoscopy (EGD)  Vomiting of blood or coffee ground material  New chest pain or pain under the shoulder blades  Painful or persistently difficult swallowing  New shortness of breath  Fever of 100F or higher  Black, tarry-looking stools  For urgent or emergent issues, a gastroenterologist can be reached at any hour by calling [phone].   DIET:  We do recommend a small meal at first, but then you may proceed to your regular diet.  Drink plenty of fluids but you should avoid alcoholic beverages for 24 hours.  ACTIVITY:  You should plan to take it easy for the rest of today and you should NOT DRIVE or use heavy machinery until tomorrow (because of the sedation medicines used  during the test).    FOLLOW UP: Our staff will call the number listed on your records the next business day following your procedure to check on you and address any questions or concerns that you may have regarding the information given to you following your procedure. If we do not reach you, we will leave a message.  However, if you are feeling well and you are not experiencing any problems, there is no need to return our call.  We will assume that you have returned to your regular daily activities without incident.  If any biopsies were taken you will be contacted by phone or by letter within the next 1-3 weeks.  Please call us at [phone] if you have not heard about the biopsies in 3 weeks.    SIGNATURES/CONFIDENTIALITY: You and/or your care partner have signed paperwork which will be entered into your electronic medical record.  These signatures attest to the fact that that the information above on your After Visit Summary has been reviewed and is understood.  Full responsibility of the confidentiality of this discharge information lies with you and/or your care-partner. 

## 2017-11-27 ENCOUNTER — Ambulatory Visit (INDEPENDENT_AMBULATORY_CARE_PROVIDER_SITE_OTHER): Payer: BLUE CROSS/BLUE SHIELD | Admitting: Internal Medicine

## 2017-11-27 ENCOUNTER — Telehealth: Payer: Self-pay

## 2017-11-27 ENCOUNTER — Other Ambulatory Visit (INDEPENDENT_AMBULATORY_CARE_PROVIDER_SITE_OTHER): Payer: BLUE CROSS/BLUE SHIELD

## 2017-11-27 ENCOUNTER — Encounter: Payer: Self-pay | Admitting: Internal Medicine

## 2017-11-27 VITALS — BP 96/68 | HR 76 | Temp 98.6°F | Resp 16 | Ht 61.0 in | Wt 120.0 lb

## 2017-11-27 DIAGNOSIS — J302 Other seasonal allergic rhinitis: Secondary | ICD-10-CM

## 2017-11-27 DIAGNOSIS — Z0000 Encounter for general adult medical examination without abnormal findings: Secondary | ICD-10-CM

## 2017-11-27 DIAGNOSIS — E039 Hypothyroidism, unspecified: Secondary | ICD-10-CM

## 2017-11-27 DIAGNOSIS — M85852 Other specified disorders of bone density and structure, left thigh: Secondary | ICD-10-CM | POA: Diagnosis not present

## 2017-11-27 DIAGNOSIS — M85851 Other specified disorders of bone density and structure, right thigh: Secondary | ICD-10-CM | POA: Diagnosis not present

## 2017-11-27 DIAGNOSIS — G629 Polyneuropathy, unspecified: Secondary | ICD-10-CM | POA: Diagnosis not present

## 2017-11-27 DIAGNOSIS — F411 Generalized anxiety disorder: Secondary | ICD-10-CM

## 2017-11-27 DIAGNOSIS — G6289 Other specified polyneuropathies: Secondary | ICD-10-CM

## 2017-11-27 DIAGNOSIS — J309 Allergic rhinitis, unspecified: Secondary | ICD-10-CM | POA: Insufficient documentation

## 2017-11-27 LAB — CBC WITH DIFFERENTIAL/PLATELET
Basophils Absolute: 0.1 10*3/uL (ref 0.0–0.1)
Basophils Relative: 0.9 % (ref 0.0–3.0)
EOS PCT: 0.8 % (ref 0.0–5.0)
Eosinophils Absolute: 0.1 10*3/uL (ref 0.0–0.7)
HCT: 37.5 % (ref 36.0–46.0)
Hemoglobin: 12.8 g/dL (ref 12.0–15.0)
LYMPHS ABS: 1.7 10*3/uL (ref 0.7–4.0)
Lymphocytes Relative: 26.6 % (ref 12.0–46.0)
MCHC: 34.1 g/dL (ref 30.0–36.0)
MCV: 85.7 fl (ref 78.0–100.0)
Monocytes Absolute: 0.3 10*3/uL (ref 0.1–1.0)
Monocytes Relative: 5.2 % (ref 3.0–12.0)
NEUTROS ABS: 4.3 10*3/uL (ref 1.4–7.7)
NEUTROS PCT: 66.5 % (ref 43.0–77.0)
PLATELETS: 267 10*3/uL (ref 150.0–400.0)
RBC: 4.38 Mil/uL (ref 3.87–5.11)
RDW: 13.2 % (ref 11.5–15.5)
WBC: 6.5 10*3/uL (ref 4.0–10.5)

## 2017-11-27 LAB — COMPREHENSIVE METABOLIC PANEL
ALT: 13 U/L (ref 0–35)
AST: 15 U/L (ref 0–37)
Albumin: 4.4 g/dL (ref 3.5–5.2)
Alkaline Phosphatase: 59 U/L (ref 39–117)
BILIRUBIN TOTAL: 1.4 mg/dL — AB (ref 0.2–1.2)
BUN: 12 mg/dL (ref 6–23)
CALCIUM: 9 mg/dL (ref 8.4–10.5)
CO2: 30 meq/L (ref 19–32)
Chloride: 103 mEq/L (ref 96–112)
Creatinine, Ser: 0.54 mg/dL (ref 0.40–1.20)
GFR: 123.53 mL/min (ref >60.00)
GLUCOSE: 115 mg/dL — AB (ref 70–99)
POTASSIUM: 3.9 meq/L (ref 3.5–5.1)
Sodium: 140 mEq/L (ref 135–145)
Total Protein: 7.3 g/dL (ref 6.0–8.3)

## 2017-11-27 LAB — LIPID PANEL
CHOL/HDL RATIO: 3
Cholesterol: 168 mg/dL (ref 0–200)
HDL: 60.3 mg/dL (ref >39.00)
LDL Cholesterol: 85 mg/dL (ref 0–99)
NONHDL: 107.4
TRIGLYCERIDES: 114 mg/dL (ref 0.0–149.0)
VLDL: 22.8 mg/dL (ref 0.0–40.0)

## 2017-11-27 LAB — TSH: TSH: 1.95 u[IU]/mL (ref 0.35–4.50)

## 2017-11-27 MED ORDER — GABAPENTIN 300 MG PO CAPS: 300.0000 mg | ORAL_CAPSULE | Freq: Two times a day (BID) | ORAL | 3 refills | Status: DC

## 2017-11-27 MED ORDER — ESCITALOPRAM OXALATE 10 MG PO TABS: 10.0000 mg | ORAL_TABLET | Freq: Every day | ORAL | 3 refills | Status: DC

## 2017-11-27 MED ORDER — LEVOTHYROXINE SODIUM 75 MCG PO TABS: ORAL_TABLET | ORAL | 3 refills | Status: DC

## 2017-11-27 NOTE — Patient Instructions (Addendum)
Test(s) ordered today. Your results will be released to [facility] (or called to you) after review, usually within 72hours after test completion. If any changes need to be made, you will be notified at that same time.  All other Health Maintenance issues reviewed.   All recommended immunizations and age-appropriate screenings are up-to-date or discussed.  No immunizations administered today.   Medications reviewed and updated.  Changes include starting lexapro 10 mg daily.  Your prescription(s) have been submitted to your pharmacy. Please take as directed and contact our office if you believe you are having problem(s) with the medication(s).   Please followup in one year for a physical   Health Maintenance, Female Adopting a healthy lifestyle and getting preventive care can go a long way to promote health and wellness. Talk with your health care provider about what schedule of regular examinations is right for you. This is a good chance for you to check in with your provider about disease prevention and staying healthy. In between checkups, there are plenty of things you can do on your own. Experts have done a lot of research about which lifestyle changes and preventive measures are most likely to keep you healthy. Ask your health care provider for more information. Weight and diet Eat a healthy diet  Be sure to include plenty of vegetables, fruits, low-fat dairy products, and lean protein.  Do not eat a lot of foods high in solid fats, added sugars, or salt.  Get regular exercise. This is one of the most important things you can do for your health. ? Most adults should exercise for at least 150 minutes each week. The exercise should increase your heart rate and make you sweat (moderate-intensity exercise). ? Most adults should also do strengthening exercises at least twice a week. This is in addition to the moderate-intensity exercise.  Maintain a healthy weight  Body mass index (BMI)  is a measurement that can be used to identify possible weight problems. It estimates body fat based on height and weight. Your health care provider can help determine your BMI and help you achieve or maintain a healthy weight.  For females 56 years of age and older: ? A BMI below 18.5 is considered underweight. ? A BMI of 18.5 to 24.9 is normal. ? A BMI of 25 to 29.9 is considered overweight. ? A BMI of 30 and above is considered obese.  Watch levels of cholesterol and blood lipids  You should start having your blood tested for lipids and cholesterol at 58 years of age, then have this test every 5 years.  You may need to have your cholesterol levels checked more often if: ? Your lipid or cholesterol levels are high. ? You are older than 58 years of age. ? You are at high risk for heart disease.  Cancer screening Lung Cancer  Lung cancer screening is recommended for adults 102-10 years old who are at high risk for lung cancer because of a history of smoking.  A yearly low-dose CT scan of the lungs is recommended for people who: ? Currently smoke. ? Have quit within the past 15 years. ? Have at least a 30-pack-year history of smoking. A pack year is smoking an average of one pack of cigarettes a day for 1 year.  Yearly screening should continue until it has been 15 years since you quit.  Yearly screening should stop if you develop a health problem that would prevent you from having lung cancer treatment.  Breast Cancer  Practice breast self-awareness. This means understanding how your breasts normally appear and feel.  It also means doing regular breast self-exams. Let your health care provider know about any changes, no matter how small.  If you are in your 20s or 30s, you should have a clinical breast exam (CBE) by a health care provider every 1-3 years as part of a regular health exam.  If you are 40 or older, have a CBE every year. Also consider having a breast X-ray  (mammogram) every year.  If you have a family history of breast cancer, talk to your health care provider about genetic screening.  If you are at high risk for breast cancer, talk to your health care provider about having an MRI and a mammogram every year.  Breast cancer gene (BRCA) assessment is recommended for women who have family members with BRCA-related cancers. BRCA-related cancers include: ? Breast. ? Ovarian. ? Tubal. ? Peritoneal cancers.  Results of the assessment will determine the need for genetic counseling and BRCA1 and BRCA2 testing.  Cervical Cancer Your health care provider may recommend that you be screened regularly for cancer of the pelvic organs (ovaries, uterus, and vagina). This screening involves a pelvic examination, including checking for microscopic changes to the surface of your cervix (Pap test). You may be encouraged to have this screening done every 3 years, beginning at age 21.  For women ages 30-65, health care providers may recommend pelvic exams and Pap testing every 3 years, or they may recommend the Pap and pelvic exam, combined with testing for human papilloma virus (HPV), every 5 years. Some types of HPV increase your risk of cervical cancer. Testing for HPV may also be done on women of any age with unclear Pap test results.  Other health care providers may not recommend any screening for nonpregnant women who are considered low risk for pelvic cancer and who do not have symptoms. Ask your health care provider if a screening pelvic exam is right for you.  If you have had past treatment for cervical cancer or a condition that could lead to cancer, you need Pap tests and screening for cancer for at least 20 years after your treatment. If Pap tests have been discontinued, your risk factors (such as having a new sexual partner) need to be reassessed to determine if screening should resume. Some women have medical problems that increase the chance of getting  cervical cancer. In these cases, your health care provider may recommend more frequent screening and Pap tests.  Colorectal Cancer  This type of cancer can be detected and often prevented.  Routine colorectal cancer screening usually begins at 58 years of age and continues through 58 years of age.  Your health care provider may recommend screening at an earlier age if you have risk factors for colon cancer.  Your health care provider may also recommend using home test kits to check for hidden blood in the stool.  A small camera at the end of a tube can be used to examine your colon directly (sigmoidoscopy or colonoscopy). This is done to check for the earliest forms of colorectal cancer.  Routine screening usually begins at age 50.  Direct examination of the colon should be repeated every 5-10 years through 58 years of age. However, you may need to be screened more often if early forms of precancerous polyps or small growths are found.  Skin Cancer  Check your skin from head to toe regularly.  Tell your health care   provider about any new moles or changes in moles, especially if there is a change in a mole's shape or color.  Also tell your health care provider if you have a mole that is larger than the size of a pencil eraser.  Always use sunscreen. Apply sunscreen liberally and repeatedly throughout the day.  Protect yourself by wearing long sleeves, pants, a wide-brimmed hat, and sunglasses whenever you are outside.  Heart disease, diabetes, and high blood pressure  High blood pressure causes heart disease and increases the risk of stroke. High blood pressure is more likely to develop in: ? People who have blood pressure in the high end of the normal range (130-139/85-89 mm Hg). ? People who are overweight or obese. ? People who are African American.  If you are 18-39 years of age, have your blood pressure checked every 3-5 years. If you are 40 years of age or older, have your  blood pressure checked every year. You should have your blood pressure measured twice-once when you are at a hospital or clinic, and once when you are not at a hospital or clinic. Record the average of the two measurements. To check your blood pressure when you are not at a hospital or clinic, you can use: ? An automated blood pressure machine at a pharmacy. ? A home blood pressure monitor.  If you are between 55 years and 79 years old, ask your health care provider if you should take aspirin to prevent strokes.  Have regular diabetes screenings. This involves taking a blood sample to check your fasting blood sugar level. ? If you are at a normal weight and have a low risk for diabetes, have this test once every three years after 58 years of age. ? If you are overweight and have a high risk for diabetes, consider being tested at a younger age or more often. Preventing infection Hepatitis B  If you have a higher risk for hepatitis B, you should be screened for this virus. You are considered at high risk for hepatitis B if: ? You were born in a country where hepatitis B is common. Ask your health care provider which countries are considered high risk. ? Your parents were born in a high-risk country, and you have not been immunized against hepatitis B (hepatitis B vaccine). ? You have HIV or AIDS. ? You use needles to inject street drugs. ? You live with someone who has hepatitis B. ? You have had sex with someone who has hepatitis B. ? You get hemodialysis treatment. ? You take certain medicines for conditions, including cancer, organ transplantation, and autoimmune conditions.  Hepatitis C  Blood testing is recommended for: ? Everyone born from 1945 through 1965. ? Anyone with known risk factors for hepatitis C.  Sexually transmitted infections (STIs)  You should be screened for sexually transmitted infections (STIs) including gonorrhea and chlamydia if: ? You are sexually active and  are younger than 58 years of age. ? You are older than 58 years of age and your health care provider tells you that you are at risk for this type of infection. ? Your sexual activity has changed since you were last screened and you are at an increased risk for chlamydia or gonorrhea. Ask your health care provider if you are at risk.  If you do not have HIV, but are at risk, it may be recommended that you take a prescription medicine daily to prevent HIV infection. This is called pre-exposure prophylaxis (PrEP). You   are considered at risk if: ? You are sexually active and do not regularly use condoms or know the HIV status of your partner(s). ? You take drugs by injection. ? You are sexually active with a partner who has HIV.  Talk with your health care provider about whether you are at high risk of being infected with HIV. If you choose to begin PrEP, you should first be tested for HIV. You should then be tested every 3 months for as long as you are taking PrEP. Pregnancy  If you are premenopausal and you may become pregnant, ask your health care provider about preconception counseling.  If you may become pregnant, take 400 to 800 micrograms (mcg) of folic acid every day.  If you want to prevent pregnancy, talk to your health care provider about birth control (contraception). Osteoporosis and menopause  Osteoporosis is a disease in which the bones lose minerals and strength with aging. This can result in serious bone fractures. Your risk for osteoporosis can be identified using a bone density scan.  If you are 65 years of age or older, or if you are at risk for osteoporosis and fractures, ask your health care provider if you should be screened.  Ask your health care provider whether you should take a calcium or vitamin D supplement to lower your risk for osteoporosis.  Menopause may have certain physical symptoms and risks.  Hormone replacement therapy may reduce some of these symptoms and  risks. Talk to your health care provider about whether hormone replacement therapy is right for you. Follow these instructions at home:  Schedule regular health, dental, and eye exams.  Stay current with your immunizations.  Do not use any tobacco products including cigarettes, chewing tobacco, or electronic cigarettes.  If you are pregnant, do not drink alcohol.  If you are breastfeeding, limit how much and how often you drink alcohol.  Limit alcohol intake to no more than 1 drink per day for nonpregnant women. One drink equals 12 ounces of beer, 5 ounces of wine, or 1 ounces of hard liquor.  Do not use street drugs.  Do not share needles.  Ask your health care provider for help if you need support or information about quitting drugs.  Tell your health care provider if you often feel depressed.  Tell your health care provider if you have ever been abused or do not feel safe at home. This information is not intended to replace advice given to you by your health care provider. Make sure you discuss any questions you have with your health care provider. Document Released: 04/24/2011 Document Revised: 03/16/2016 Document Reviewed: 07/13/2015 Elsevier Interactive Patient Education  2018 Elsevier Inc.  

## 2017-11-27 NOTE — Assessment & Plan Note (Signed)
Last dexa 2017 - will repeat next year Stressed regular exercise Taking vitamin d Increase calcium in diet

## 2017-11-27 NOTE — Assessment & Plan Note (Signed)
Seasonal otc allergy medications as needed

## 2017-11-27 NOTE — Progress Notes (Signed)
Subjective:    Patient ID: Sharon Stephenson, female    DOB: 09/30/1961, 57 y.o.   MRN: [id number]  HPI She is here for a physical exam.   She has an appointment [place] [facility] next week.   She sees neuro on 4/26.  She had her EGD yesterday and had biopsies.  They are ruling out celiac disease.   She plans on starting to exercise.   Medications and allergies reviewed with patient and updated if appropriate.  Patient Active Problem List   Diagnosis Date Noted  . GERD (gastroesophageal reflux disease) 11/12/2017  . Right calf pain 11/12/2017  . Abdominal pain, epigastric 05/26/2016  . Small fiber polyneuropathy (HCC) 11/23/2014  . Osteopenia 01/05/2014  . Lumbago without sciatica 02/14/2012  . Sciatica 04/20/2011  . CERVICALGIA 10/10/2010  . ANEMIA, OTHER UNSPEC 08/06/2009  . Anxiety state 06/20/2007  . Hypothyroidism 06/17/2007  . DEPRESSION 06/17/2007    Current Outpatient Medications on File Prior to Visit  Medication Sig Dispense Refill  . ALPHA LIPOIC ACID PO Take by mouth.    . Ascorbic Acid (VITAMIN C) 1000 MG tablet Take 1,000 mg by mouth daily.    . Biotin 5 MG CAPS Take by mouth.    . Calcium-Magnesium-Vitamin D (CALCIUM MAGNESIUM PO) Take 1,000 mg by mouth.    . Cholecalciferol (VITAMIN D3) 1000 UNITS CAPS Take by mouth daily.      . COLLAGEN PO Take by mouth.    . Evening Primrose Oil 500 MG CAPS Take 1 capsule by mouth daily.    . Fish Oil-Cholecalciferol (OMEGA-3 FISH OIL-VITAMIN D3) 1200-1000 MG-UNIT CAPS Take by mouth daily.    Marland Kitchen. gabapentin (NEURONTIN) 300 MG capsule Take 300 mg by mouth 2 (two) times daily. 1-2 daily as needed    . levothyroxine (SYNTHROID, LEVOTHROID) 75 MCG tablet TAKE 1 TABLET (75 MCG TOTAL) BY MOUTH DAILY BEFORE BREAKFAST. 90 tablet 3  . Magnesium 200 MG TABS Take by mouth daily.      . Melatonin 1 MG TABS Take 0.5 mg by mouth at bedtime as needed.     . NON FORMULARY Zincl supplement ovc as needed    . NON FORMULARY Iron supplement  take once a day    . Probiotic Product (PROBIOTIC FORMULA) CAPS Take by mouth daily.      . Turmeric 500 MG CAPS Take by mouth daily.       No current facility-administered medications on file prior to visit.     Past Medical History:  Diagnosis Date  . ANEMIA, OTHER UNSPEC   . ANXIETY   . Arthritis    joints  . Cervicalgia    chronic - follows with neuro in [place]  . DEPRESSION   . HYPOTHYROIDISM   . Lumbar radicular syndrome spring 2013  . Osteopenia 01/05/2014   DEXA @ [facility] 12/2013: -1.2  . Small fiber polyneuropathy 11/2014   Dr. Asa LenteElliot Lewitt    Past Surgical History:  Procedure Laterality Date  . BREAST SURGERY     Breast reduction  . COLONOSCOPY    . TONSILLECTOMY      Social History   Socioeconomic History  . Marital status: Married    Spouse name: None  . Number of children: None  . Years of education: None  . Highest education level: None  Social Needs  . Financial resource strain: None  . Food insecurity - worry: None  . Food insecurity - inability: None  . Transportation needs - medical: None  .  Transportation needs - non-medical: None  Occupational History  . None  Tobacco Use  . Smoking status: Former Smoker    Last attempt to quit: 10/23/1985    Years since quitting: 32.1  . Smokeless tobacco: Never Used  Substance and Sexual Activity  . Alcohol use: Yes    Alcohol/week: 0.0 oz  . Drug use: No  . Sexual activity: None  Other Topics Concern  . None  Social History Narrative   Married, lives with spouse; no children. Originally from [place]    Family History  Problem Relation Age of Onset  . Kidney disease Father   . Colon cancer Paternal Grandmother     Review of Systems  Constitutional: Negative for chills and fever.  Eyes: Negative for visual disturbance.  Respiratory: Negative for cough, shortness of breath and wheezing.   Cardiovascular: Negative for chest pain, palpitations and leg swelling.  Gastrointestinal: Negative for abdominal  pain, blood in stool, constipation and diarrhea.  Endocrine: Positive for cold intolerance.  Genitourinary: Negative for dysuria and hematuria.  Musculoskeletal: Positive for back pain (intermittent LBP).  Skin: Negative for color change and rash.  Neurological: Positive for numbness and headaches (sinus).  Psychiatric/Behavioral: Positive for sleep disturbance. Negative for dysphoric mood. The patient is nervous/anxious.        Objective:   Vitals:   11/27/17 1416  BP: 96/68  Pulse: 76  Resp: 16  Temp: 98.6 F (37 C)  SpO2: 98%   Filed Weights   11/27/17 1416  Weight: 120 lb (54.4 kg)   Body mass index is 22.67 kg/m.  Wt Readings from Last 3 Encounters:  11/27/17 120 lb (54.4 kg)  11/26/17 122 lb (55.3 kg)  11/22/17 122 lb (55.3 kg)     Physical Exam Constitutional: She appears well-developed and well-nourished. No distress.  HENT:  Head: Normocephalic and atraumatic.  Right Ear: External ear normal. Normal ear canal and TM Left Ear: External ear normal.  Normal ear canal and TM Mouth/Throat: Oropharynx is clear and moist.  Eyes: Conjunctivae and EOM are normal.  Neck: Neck supple. No tracheal deviation present. No thyromegaly present.  No carotid bruit  Cardiovascular: Normal rate, regular rhythm and normal heart sounds.   No murmur heard.  No edema. Pulmonary/Chest: Effort normal and breath sounds normal. No respiratory distress. She has no wheezes. She has no rales.  Breast: deferred to Gyn Abdominal: Soft. She exhibits no distension. There is no tenderness.  Lymphadenopathy: She has no cervical adenopathy.  Skin: Skin is warm and dry. She is not diaphoretic.  Psychiatric: She has a normal mood and affect. Her behavior is normal.        Assessment & Plan:   Physical exam: Screening blood work  ordered Immunizations  Discussed shingrix, others up to date Colonoscopy   Up to date  Mammogram  Due - will schedule Gyn  Due - will go this spring Dexa  Up  to date  - due next year Eye exams    Up to date  EKG    Last done 2013 Exercise   Active now, no regular exercise - joined gym Weight normal BMI Skin   No concern Substance abuse   none  See Problem List for Assessment and Plan of chronic medical problems.   Fu in one year

## 2017-11-27 NOTE — Assessment & Plan Note (Addendum)
To establish with neurology - Dr Allena KatzPatel - her prior neurologist retired Taking gabapentin - will refill

## 2017-11-27 NOTE — Assessment & Plan Note (Signed)
Increased anxiety Has been on lexapro and did well with it -- will restart lexapro 10 mg daily

## 2017-11-27 NOTE — Telephone Encounter (Signed)
  Follow up Call-  Call back number 11/26/2017  Post procedure Call Back phone  # [phone]  Permission to leave phone message Yes  Some recent data might be hidden     Patient questions:  Do you have a fever, pain , or abdominal swelling? No. Pain Score  0 *  Have you tolerated food without any problems? Yes.    Have you been able to return to your normal activities? Yes.    Do you have any questions about your discharge instructions: Diet   No. Medications  No. Follow up visit  No.  Do you have questions or concerns about your Care? No.  Actions: * If pain score is 4 or above: No action needed, pain <4.

## 2017-11-27 NOTE — Assessment & Plan Note (Signed)
Check tsh  Titrate med dose if needed  

## 2017-11-28 ENCOUNTER — Encounter: Payer: Self-pay | Admitting: Internal Medicine

## 2017-12-03 ENCOUNTER — Encounter: Payer: Self-pay | Admitting: Gastroenterology

## 2017-12-05 DIAGNOSIS — M79604 Pain in right leg: Secondary | ICD-10-CM | POA: Diagnosis not present

## 2017-12-05 DIAGNOSIS — I8311 Varicose veins of right lower extremity with inflammation: Secondary | ICD-10-CM | POA: Diagnosis not present

## 2017-12-05 DIAGNOSIS — I8312 Varicose veins of left lower extremity with inflammation: Secondary | ICD-10-CM | POA: Diagnosis not present

## 2017-12-07 DIAGNOSIS — I8311 Varicose veins of right lower extremity with inflammation: Secondary | ICD-10-CM | POA: Diagnosis not present

## 2017-12-07 DIAGNOSIS — I8312 Varicose veins of left lower extremity with inflammation: Secondary | ICD-10-CM | POA: Diagnosis not present

## 2017-12-20 ENCOUNTER — Other Ambulatory Visit: Payer: Self-pay

## 2017-12-20 DIAGNOSIS — M79661 Pain in right lower leg: Secondary | ICD-10-CM

## 2018-01-02 DIAGNOSIS — I8311 Varicose veins of right lower extremity with inflammation: Secondary | ICD-10-CM | POA: Diagnosis not present

## 2018-01-04 ENCOUNTER — Encounter: Payer: BLUE CROSS/BLUE SHIELD | Admitting: Vascular Surgery

## 2018-01-04 ENCOUNTER — Encounter (HOSPITAL_COMMUNITY): Payer: BLUE CROSS/BLUE SHIELD

## 2018-01-11 DIAGNOSIS — M25511 Pain in right shoulder: Secondary | ICD-10-CM | POA: Diagnosis not present

## 2018-01-24 ENCOUNTER — Telehealth: Payer: Self-pay

## 2018-01-24 NOTE — Telephone Encounter (Signed)
Left message asking patient to call back to schedule nurse visit to get first shingrix vaccine---if appt is made, let tamara,RN at [facility] office know so that both vaccines can be labeled 

## 2018-01-29 ENCOUNTER — Telehealth: Payer: Self-pay

## 2018-01-29 NOTE — Telephone Encounter (Signed)
Copied from CRM [id number]. Topic: Quick Communication - Office Called Patient >> Jan 24, 2018  9:39 AM Alonna MiniumWilliams, Tamara P, RN wrote: Reason for CRM: needs to make nurse visit to get first shingrix vaccine---let tamara,RN at [facility] office know if appt is made so that both vaccines can be labeled >> Jan 29, 2018  3:20 PM Landry MellowFoltz, Melissa J wrote: Pt got message that she can not take shingles shot because she is likely moving out of [place] and will not be in the area for the 2 nd vaccine.   Both vaccines labeled and placed in refrig

## 2018-02-05 DIAGNOSIS — Z01419 Encounter for gynecological examination (general) (routine) without abnormal findings: Secondary | ICD-10-CM | POA: Diagnosis not present

## 2018-02-05 DIAGNOSIS — Z6822 Body mass index (BMI) 22.0-22.9, adult: Secondary | ICD-10-CM | POA: Diagnosis not present

## 2018-02-05 DIAGNOSIS — Z1231 Encounter for screening mammogram for malignant neoplasm of breast: Secondary | ICD-10-CM | POA: Diagnosis not present

## 2018-02-05 DIAGNOSIS — Z124 Encounter for screening for malignant neoplasm of cervix: Secondary | ICD-10-CM | POA: Diagnosis not present

## 2018-02-12 ENCOUNTER — Encounter: Payer: Self-pay | Admitting: Internal Medicine

## 2018-02-15 ENCOUNTER — Ambulatory Visit: Payer: BLUE CROSS/BLUE SHIELD | Admitting: Neurology

## 2018-02-19 ENCOUNTER — Encounter: Payer: Self-pay | Admitting: Internal Medicine

## 2018-02-19 ENCOUNTER — Ambulatory Visit (INDEPENDENT_AMBULATORY_CARE_PROVIDER_SITE_OTHER): Payer: BLUE CROSS/BLUE SHIELD | Admitting: Internal Medicine

## 2018-02-19 VITALS — BP 120/56 | HR 76 | Temp 98.7°F | Resp 16 | Wt 121.0 lb

## 2018-02-19 DIAGNOSIS — G629 Polyneuropathy, unspecified: Secondary | ICD-10-CM | POA: Diagnosis not present

## 2018-02-19 DIAGNOSIS — F411 Generalized anxiety disorder: Secondary | ICD-10-CM

## 2018-02-19 DIAGNOSIS — G6289 Other specified polyneuropathies: Secondary | ICD-10-CM

## 2018-02-19 NOTE — Assessment & Plan Note (Addendum)
Controlled, stable Continue current dose of medication Her daughter does provide some emotional support and will sign the form so that she can have the dog with her when flying

## 2018-02-19 NOTE — Patient Instructions (Signed)
    Medications reviewed and updated.  No changes recommended at this time.     

## 2018-02-19 NOTE — Progress Notes (Signed)
Subjective:    Patient ID: Sharon Stephenson, female    DOB: Dec 26, 1960, 57 y.o.   MRN: [id number]  HPI The patient is here for follow up.  She will be in [place] from June to September.  She would like to take her dog with her but needs a form filled out for an emotional support dog to fly with her dog.  The dog has provided much emotional support.  Anxiety: She is taking her medication daily as prescribed. She denies any side effects from the medication. She feels her anxiety is well controlled and she is happy with her current dose of medication.   Polyneuropathy: She is taking 1 gabapentin at nighttime and this is helping.  She is also doing other therapies that have helped and she is encouraged.  Medications and allergies reviewed with patient and updated if appropriate.  Patient Active Problem List   Diagnosis Date Noted  . Allergic rhinitis 11/27/2017  . GERD (gastroesophageal reflux disease) 11/12/2017  . Right calf pain 11/12/2017  . Abdominal pain, epigastric 05/26/2016  . Small fiber polyneuropathy (HCC) 11/23/2014  . Osteopenia 01/05/2014  . Lumbago without sciatica 02/14/2012  . Sciatica 04/20/2011  . CERVICALGIA 10/10/2010  . Anxiety state 06/20/2007  . Hypothyroidism 06/17/2007    Current Outpatient Medications on File Prior to Visit  Medication Sig Dispense Refill  . ALPHA LIPOIC ACID PO Take by mouth.    . Ascorbic Acid (VITAMIN C) 1000 MG tablet Take 1,000 mg by mouth daily.    . Biotin 5 MG CAPS Take by mouth.    . Calcium-Magnesium-Vitamin D (CALCIUM MAGNESIUM PO) Take 1,000 mg by mouth.    . Cholecalciferol (VITAMIN D3) 1000 UNITS CAPS Take by mouth daily.      . COLLAGEN PO Take by mouth.    . escitalopram (LEXAPRO) 10 MG tablet Take 1 tablet (10 mg total) by mouth daily. 90 tablet 3  . Evening Primrose Oil 500 MG CAPS Take 1 capsule by mouth daily.    . Fish Oil-Cholecalciferol (OMEGA-3 FISH OIL-VITAMIN D3) 1200-1000 MG-UNIT CAPS Take by mouth daily.      Marland Kitchen gabapentin (NEURONTIN) 300 MG capsule Take 1 capsule (300 mg total) by mouth 2 (two) times daily. 180 capsule 3  . levothyroxine (SYNTHROID, LEVOTHROID) 75 MCG tablet TAKE 1 TABLET (75 MCG TOTAL) BY MOUTH DAILY BEFORE BREAKFAST. 90 tablet 3  . Magnesium 200 MG TABS Take by mouth daily.      . Melatonin 1 MG TABS Take 0.5 mg by mouth at bedtime as needed.     . NON FORMULARY Zincl supplement ovc as needed    . NON FORMULARY Iron supplement take once a day    . Probiotic Product (PROBIOTIC FORMULA) CAPS Take by mouth daily.      . Turmeric 500 MG CAPS Take by mouth daily.       No current facility-administered medications on file prior to visit.     Past Medical History:  Diagnosis Date  . ANEMIA, OTHER UNSPEC   . ANXIETY   . Arthritis    joints  . Cervicalgia    chronic - follows with neuro in [place]  . DEPRESSION   . HYPOTHYROIDISM   . Lumbar radicular syndrome spring 2013  . Osteopenia 01/05/2014   DEXA @ [facility] 12/2013: -1.2  . Small fiber polyneuropathy 11/2014   Dr. Asa Lente    Past Surgical History:  Procedure Laterality Date  . BREAST SURGERY     Breast  reduction  . COLONOSCOPY    . TONSILLECTOMY      Social History   Socioeconomic History  . Marital status: Married    Spouse name: Not on file  . Number of children: Not on file  . Years of education: Not on file  . Highest education level: Not on file  Occupational History  . Not on file  Social Needs  . Financial resource strain: Not on file  . Food insecurity:    Worry: Not on file    Inability: Not on file  . Transportation needs:    Medical: Not on file    Non-medical: Not on file  Tobacco Use  . Smoking status: Former Smoker    Last attempt to quit: 10/23/1985    Years since quitting: 32.3  . Smokeless tobacco: Never Used  Substance and Sexual Activity  . Alcohol use: Yes    Alcohol/week: 0.0 oz  . Drug use: No  . Sexual activity: Not on file  Lifestyle  . Physical activity:    Days per week:  Not on file    Minutes per session: Not on file  . Stress: Not on file  Relationships  . Social connections:    Talks on phone: Not on file    Gets together: Not on file    Attends religious service: Not on file    Active member of club or organization: Not on file    Attends meetings of clubs or organizations: Not on file    Relationship status: Not on file  Other Topics Concern  . Not on file  Social History Narrative   Married, lives with spouse; no children. Originally from [place]    Family History  Problem Relation Age of Onset  . Kidney disease Father   . Colon cancer Paternal Grandmother     Review of Systems  Gastrointestinal: Negative for nausea.  Neurological: Negative for headaches.  Psychiatric/Behavioral: Negative for dysphoric mood and sleep disturbance (improved). The patient is nervous/anxious (controlled).        Objective:   Vitals:   02/19/18 1340  BP: (!) 120/56  Pulse: 76  Resp: 16  Temp: 98.7 F (37.1 C)  SpO2: 98%   BP Readings from Last 3 Encounters:  02/19/18 (!) 120/56  11/27/17 96/68  11/26/17 101/62   Wt Readings from Last 3 Encounters:  02/19/18 121 lb (54.9 kg)  11/27/17 120 lb (54.4 kg)  11/26/17 122 lb (55.3 kg)   Body mass index is 22.86 kg/m.   Physical Exam    Constitutional: Appears well-developed and well-nourished. No distress.  HENT:  Head: Normocephalic and atraumatic.  Skin: Skin is warm and dry. Not diaphoretic.  Psychiatric: Normal mood and affect. Behavior is normal.      Assessment & Plan:    See Problem List for Assessment and Plan of chronic medical problems.

## 2018-02-19 NOTE — Assessment & Plan Note (Signed)
Taking gabapentin once a day continue

## 2020-12-13 ENCOUNTER — Ambulatory Visit: Payer: BLUE CROSS/BLUE SHIELD | Admitting: Internal Medicine

## 2020-12-26 NOTE — Progress Notes (Signed)
Subjective:    Patient ID: Sharon Stephenson, female    DOB: 05/14/1961, 60 y.o.   MRN: [id number]  HPI The patient is here for follow up of their chronic medical problems, including anxiety, polyneuropathy, hypothyroidism  She has developed dermatitis.  Otherwise her medical problems are fairly stable since I saw her last.   Her thyroid dose was decreased and she was changed to paxil.  She is happy with her current medication.   Her neuropathy was better in [place].  She take gabapentin as needed.  She does light therapy.  Medications and allergies reviewed with patient and updated if appropriate.  Patient Active Problem List   Diagnosis Date Noted  . Allergic rhinitis 11/27/2017  . GERD (gastroesophageal reflux disease) 11/12/2017  . Right calf pain 11/12/2017  . Abdominal pain, epigastric 05/26/2016  . Small fiber polyneuropathy (HCC) 11/23/2014  . Osteopenia 01/05/2014  . Lumbago without sciatica 02/14/2012  . Sciatica 04/20/2011  . CERVICALGIA 10/10/2010  . Anxiety state 06/20/2007  . Hypothyroidism 06/17/2007    Current Outpatient Medications on File Prior to Visit  Medication Sig Dispense Refill  . ALPHA LIPOIC ACID PO Take by mouth.    . Ascorbic Acid (VITAMIN C) 1000 MG tablet Take 1,000 mg by mouth daily.    . Biotin 5 MG CAPS Take by mouth.    . Calcium-Magnesium-Vitamin D (CALCIUM MAGNESIUM PO) Take 1,000 mg by mouth.    . Cholecalciferol (VITAMIN D3) 1000 UNITS CAPS Take by mouth daily.    . COLLAGEN PO Take by mouth.    . esomeprazole (NEXIUM) 40 MG capsule esomeprazole magnesium 40 mg capsule,delayed release    . Evening Primrose Oil 500 MG CAPS Take 1 capsule by mouth daily.    . Fish Oil-Cholecalciferol (OMEGA-3 FISH OIL-VITAMIN D3) 1200-1000 MG-UNIT CAPS Take by mouth daily.    Marland Kitchen gabapentin (NEURONTIN) 300 MG capsule Take 1 capsule (300 mg total) by mouth 2 (two) times daily. 180 capsule 3  . ketoconazole (NIZORAL) 2 % cream Apply 1 application topically as needed  for irritation.    Marland Kitchen levothyroxine (SYNTHROID) 50 MCG tablet Take 50 mcg by mouth daily.    . Magnesium 200 MG TABS Take by mouth daily.    . Melatonin 1 MG TABS Take 0.5 mg by mouth at bedtime as needed.    . NON FORMULARY Zincl supplement ovc as needed    . NON FORMULARY Iron supplement take once a day    . PARoxetine (PAXIL) 10 MG tablet Take 10 mg by mouth daily.    . Probiotic Product (PROBIOTIC FORMULA) CAPS Take by mouth daily.    . Turmeric 500 MG CAPS Take by mouth daily.     No current facility-administered medications on file prior to visit.    Past Medical History:  Diagnosis Date  . ANEMIA, OTHER UNSPEC   . ANXIETY   . Arthritis    joints  . Cervicalgia    chronic - follows with neuro in [place]  . DEPRESSION   . HYPOTHYROIDISM   . Lumbar radicular syndrome spring 2013  . Osteopenia 01/05/2014   DEXA @ [facility] 12/2013: -1.2  . Small fiber polyneuropathy 11/2014   Dr. Asa Lente    Past Surgical History:  Procedure Laterality Date  . BREAST SURGERY     Breast reduction  . COLONOSCOPY    . TONSILLECTOMY      Social History   Socioeconomic History  . Marital status: Married    Spouse name:  Not on file  . Number of children: Not on file  . Years of education: Not on file  . Highest education level: Not on file  Occupational History  . Not on file  Tobacco Use  . Smoking status: Former Smoker    Quit date: 10/23/1985    Years since quitting: 35.2  . Smokeless tobacco: Never Used  Substance and Sexual Activity  . Alcohol use: Yes    Alcohol/week: 0.0 standard drinks  . Drug use: No  . Sexual activity: Not on file  Other Topics Concern  . Not on file  Social History Narrative   Married, lives with spouse; no children. Originally from [employer] [place] Strain: Not on [employer] Insecurity: Not on file  Transportation Needs: Not on file  Physical Activity: Not on file  Stress: Not on file  Social Connections: Not  on file    Family History  Problem Relation Age of Onset  . Kidney disease Father   . Colon cancer Paternal Grandmother     Review of Systems  Constitutional: Negative for chills and fever.  Respiratory: Negative for cough, shortness of breath and wheezing.   Cardiovascular: Negative for chest pain, palpitations and leg swelling.  Gastrointestinal: Negative for abdominal pain, constipation and diarrhea.  Neurological: Positive for headaches (occ, sinus). Negative for light-headedness.  Psychiatric/Behavioral: Positive for sleep disturbance (at times). The patient is nervous/anxious.        Objective:   Vitals:   12/27/20 1359  BP: 100/64  Pulse: 76  Temp: 98.5 F (36.9 C)  SpO2: 97%   BP Readings from Last 3 Encounters:  12/27/20 100/64  02/19/18 (!) 120/56  11/27/17 96/68   Wt Readings from Last 3 Encounters:  12/27/20 118 lb (53.5 kg)  02/19/18 121 lb (54.9 kg)  11/27/17 120 lb (54.4 kg)   Body mass index is 22.3 kg/m.   Physical Exam    Constitutional: Appears well-developed and well-nourished. No distress.  HENT:  Head: Normocephalic and atraumatic.  Neck: Neck supple. No tracheal deviation present. No thyromegaly present.  No cervical lymphadenopathy Cardiovascular: Normal rate, regular rhythm and normal heart sounds.   No murmur heard. No carotid bruit .  No edema Pulmonary/Chest: Effort normal and breath sounds normal. No respiratory distress. No has no wheezes. No rales.  Abdomen: soft, NT, ND Skin: Skin is warm and dry. Not diaphoretic.  Psychiatric: Normal mood and affect. Behavior is normal.      Assessment & Plan:    Hm up to date per patient  See Problem List for Assessment and Plan of chronic medical problems.    This visit occurred during the SARS-CoV-2 public health emergency.  Safety protocols were in place, including screening questions prior to the visit, additional usage of staff PPE, and extensive cleaning of exam room while  observing appropriate contact time as indicated for disinfecting solutions.

## 2020-12-26 NOTE — Patient Instructions (Addendum)
  Medications changes include :   none  Your prescription(s) have been submitted to your pharmacy. Please take as directed and contact our office if you believe you are having problem(s) with the medication(s).    Please followup in 6 months   

## 2020-12-27 ENCOUNTER — Ambulatory Visit (INDEPENDENT_AMBULATORY_CARE_PROVIDER_SITE_OTHER): Payer: BC Managed Care – PPO | Admitting: Internal Medicine

## 2020-12-27 ENCOUNTER — Other Ambulatory Visit: Payer: Self-pay

## 2020-12-27 ENCOUNTER — Encounter: Payer: Self-pay | Admitting: Internal Medicine

## 2020-12-27 VITALS — BP 100/64 | HR 76 | Temp 98.5°F | Ht 61.0 in | Wt 118.0 lb

## 2020-12-27 DIAGNOSIS — G6289 Other specified polyneuropathies: Secondary | ICD-10-CM | POA: Diagnosis not present

## 2020-12-27 DIAGNOSIS — F411 Generalized anxiety disorder: Secondary | ICD-10-CM

## 2020-12-27 DIAGNOSIS — E039 Hypothyroidism, unspecified: Secondary | ICD-10-CM

## 2020-12-27 MED ORDER — LEVOTHYROXINE SODIUM 50 MCG PO TABS: 50.0000 ug | ORAL_TABLET | Freq: Every day | ORAL | 3 refills | Status: DC

## 2020-12-27 MED ORDER — PAROXETINE HCL 10 MG PO TABS: 10.0000 mg | ORAL_TABLET | Freq: Every day | ORAL | 3 refills | Status: DC

## 2020-12-27 NOTE — Assessment & Plan Note (Signed)
Chronic Controlled, stable Continue paxil 10 mg daily  

## 2020-12-27 NOTE — Assessment & Plan Note (Addendum)
Chronic  Clinically euthyroid Currently taking levothyroxine 50 mcg daily  - will continue Last tsh 12/21

## 2020-12-27 NOTE — Assessment & Plan Note (Addendum)
Chronic Taking 1-4 gabapentin a day Has restarted light therapy Will start regularly walking once settled Continue above

## 2021-03-15 DIAGNOSIS — M47816 Spondylosis without myelopathy or radiculopathy, lumbar region: Secondary | ICD-10-CM | POA: Diagnosis not present

## 2021-03-15 DIAGNOSIS — M47812 Spondylosis without myelopathy or radiculopathy, cervical region: Secondary | ICD-10-CM | POA: Diagnosis not present

## 2021-03-22 DIAGNOSIS — M5459 Other low back pain: Secondary | ICD-10-CM | POA: Diagnosis not present

## 2021-03-28 NOTE — Progress Notes (Signed)
Subjective:    Patient ID: Sharon Stephenson, female    DOB: 07-Feb-1961, 60 y.o.   MRN: [id number]  HPI The patient is here for an acute visit.  She feel tired all the time.  She wants to sleep all the time.  She feels hot or cold.  She has noticed some hair loss.    ? Related to stress or fatigue.  She wonders about her thyroid.  Has had some lowe back pain -- saw ortho and doing PT.   Stress level is high.  Before she left [place] and moved back to [place], which is about 6 months ago they did change her thyroid dose, which she just recalled and wonders if maybe that contributing.  Medications and allergies reviewed with patient and updated if appropriate.  Patient Active Problem List   Diagnosis Date Noted  . Fatigue 03/29/2021  . Allergic rhinitis 11/27/2017  . GERD (gastroesophageal reflux disease) 11/12/2017  . Right calf pain 11/12/2017  . Abdominal pain, epigastric 05/26/2016  . Small fiber polyneuropathy (HCC) 11/23/2014  . Osteopenia 01/05/2014  . Lumbago without sciatica 02/14/2012  . Sciatica 04/20/2011  . CERVICALGIA 10/10/2010  . Anxiety state 06/20/2007  . Hypothyroidism 06/17/2007    Current Outpatient Medications on File Prior to Visit  Medication Sig Dispense Refill  . ALPHA LIPOIC ACID PO Take by mouth.    . Ascorbic Acid (VITAMIN C) 1000 MG tablet Take 1,000 mg by mouth daily.    . Biotin 5 MG CAPS Take by mouth.    . Calcium-Magnesium-Vitamin D (CALCIUM MAGNESIUM PO) Take 1,000 mg by mouth.    . COLLAGEN PO Take by mouth.    . Evening Primrose Oil 500 MG CAPS Take 1 capsule by mouth daily.    . Fish Oil-Cholecalciferol (OMEGA-3 FISH OIL-VITAMIN D3) 1200-1000 MG-UNIT CAPS Take by mouth daily.    Marland Kitchen gabapentin (NEURONTIN) 300 MG capsule Take 1 capsule (300 mg total) by mouth 2 (two) times daily. 180 capsule 3  . ketoconazole (NIZORAL) 2 % cream Apply 1 application topically as needed for irritation.    Marland Kitchen levothyroxine (SYNTHROID) 50 MCG tablet Take  1 tablet (50 mcg total) by mouth daily. 90 tablet 3  . Magnesium 200 MG TABS Take by mouth daily.    . NON FORMULARY Zincl supplement ovc as needed    . NON FORMULARY Iron supplement take once a day    . PARoxetine (PAXIL) 10 MG tablet Take 1 tablet (10 mg total) by mouth daily. 90 tablet 3  . Probiotic Product (PROBIOTIC FORMULA) CAPS Take by mouth daily.    . Turmeric 500 MG CAPS Take by mouth daily.     No current facility-administered medications on file prior to visit.    Past Medical History:  Diagnosis Date  . ANEMIA, OTHER UNSPEC   . ANXIETY   . Arthritis    joints  . Cervicalgia    chronic - follows with neuro in [place]  . DEPRESSION   . HYPOTHYROIDISM   . Lumbar radicular syndrome spring 2013  . Osteopenia 01/05/2014   DEXA @ [facility] 12/2013: -1.2  . Small fiber polyneuropathy 11/2014   Dr. Asa Lente    Past Surgical History:  Procedure Laterality Date  . BREAST SURGERY     Breast reduction  . COLONOSCOPY    . TONSILLECTOMY      Social History   Socioeconomic History  . Marital status: Married    Spouse name: Not on file  . Number  of children: Not on file  . Years of education: Not on file  . Highest education level: Not on file  Occupational History  . Not on file  Tobacco Use  . Smoking status: Former Smoker    Quit date: 10/23/1985    Years since quitting: 35.4  . Smokeless tobacco: Never Used  Substance and Sexual Activity  . Alcohol use: Yes    Alcohol/week: 0.0 standard drinks  . Drug use: No  . Sexual activity: Not on file  Other Topics Concern  . Not on file  Social History Narrative   Married, lives with spouse; no children. Originally from [employer] [place] Strain: Not on [employer] Insecurity: Not on file  Transportation Needs: Not on file  Physical Activity: Not on file  Stress: Not on file  Social Connections: Not on file    Family History  Problem Relation Age of Onset  . Kidney disease  Father   . Colon cancer Paternal Grandmother     Review of Systems  Constitutional: Positive for fatigue. Negative for fever.  Respiratory: Negative for cough, shortness of breath and wheezing.   Cardiovascular: Negative for chest pain, palpitations and leg swelling.  Gastrointestinal: Negative for abdominal pain and nausea.  Musculoskeletal: Positive for back pain and joint swelling (fingers joints swell at times). Negative for arthralgias.  Neurological: Positive for headaches (sinus, allergies). Negative for light-headedness.  Psychiatric/Behavioral: Negative for sleep disturbance.       Objective:   Vitals:   03/29/21 1043  BP: 104/60  Pulse: 83  Temp: 97.9 F (36.6 C)  SpO2: 98%   BP Readings from Last 3 Encounters:  03/29/21 104/60  12/27/20 100/64  02/19/18 (!) 120/56   Wt Readings from Last 3 Encounters:  03/29/21 119 lb 6.4 oz (54.2 kg)  12/27/20 118 lb (53.5 kg)  02/19/18 121 lb (54.9 kg)   Body mass index is 22.56 kg/m.   Physical Exam    Constitutional: Appears well-developed and well-nourished. No distress.  Head: Normocephalic and atraumatic.  Neck: Neck supple. No tracheal deviation present. No thyromegaly present.  No cervical lymphadenopathy Cardiovascular: Normal rate, regular rhythm and normal heart sounds.  No murmur heard. No carotid bruit .  No edema Pulmonary/Chest: Effort normal and breath sounds normal. No respiratory distress. No has no wheezes. No rales.  Abdomen: soft, NT, ND Skin: Skin is warm and dry. Not diaphoretic.  Psychiatric: Normal mood and affect. Behavior is normal.       Assessment & Plan:    See Problem List for Assessment and Plan of chronic medical problems.    This visit occurred during the SARS-CoV-2 public health emergency.  Safety protocols were in place, including screening questions prior to the visit, additional usage of staff PPE, and extensive cleaning of exam room while observing appropriate contact time  as indicated for disinfecting solutions.

## 2021-03-29 ENCOUNTER — Ambulatory Visit (INDEPENDENT_AMBULATORY_CARE_PROVIDER_SITE_OTHER): Payer: BC Managed Care – PPO | Admitting: Internal Medicine

## 2021-03-29 ENCOUNTER — Other Ambulatory Visit: Payer: Self-pay

## 2021-03-29 ENCOUNTER — Encounter: Payer: Self-pay | Admitting: Internal Medicine

## 2021-03-29 VITALS — BP 104/60 | HR 83 | Temp 97.9°F | Ht 61.0 in | Wt 119.4 lb

## 2021-03-29 DIAGNOSIS — E039 Hypothyroidism, unspecified: Secondary | ICD-10-CM

## 2021-03-29 DIAGNOSIS — R5383 Other fatigue: Secondary | ICD-10-CM | POA: Insufficient documentation

## 2021-03-29 DIAGNOSIS — F411 Generalized anxiety disorder: Secondary | ICD-10-CM | POA: Diagnosis not present

## 2021-03-29 LAB — COMPREHENSIVE METABOLIC PANEL
ALT: 21 U/L (ref 0–35)
AST: 24 U/L (ref 0–37)
Albumin: 4.6 g/dL (ref 3.5–5.2)
Alkaline Phosphatase: 69 U/L (ref 39–117)
BUN: 18 mg/dL (ref 6–23)
CO2: 29 mEq/L (ref 19–32)
Calcium: 9.5 mg/dL (ref 8.4–10.5)
Chloride: 102 mEq/L (ref 96–112)
Creatinine, Ser: 0.63 mg/dL (ref 0.40–1.20)
GFR: 96.25 mL/min (ref >60.00)
Glucose, Bld: 94 mg/dL (ref 70–99)
Potassium: 3.7 mEq/L (ref 3.5–5.1)
Sodium: 141 mEq/L (ref 135–145)
Total Bilirubin: 1.3 mg/dL — ABNORMAL HIGH (ref 0.2–1.2)
Total Protein: 7.6 g/dL (ref 6.0–8.3)

## 2021-03-29 LAB — CBC WITH DIFFERENTIAL/PLATELET
Basophils Absolute: 0 10*3/uL (ref 0.0–0.1)
Basophils Relative: 0.8 % (ref 0.0–3.0)
Eosinophils Absolute: 0.1 10*3/uL (ref 0.0–0.7)
Eosinophils Relative: 1.5 % (ref 0.0–5.0)
HCT: 36.1 % (ref 36.0–46.0)
Hemoglobin: 12.4 g/dL (ref 12.0–15.0)
Lymphocytes Relative: 44.3 % (ref 12.0–46.0)
Lymphs Abs: 2.5 10*3/uL (ref 0.7–4.0)
MCHC: 34.3 g/dL (ref 30.0–36.0)
MCV: 85.2 fl (ref 78.0–100.0)
Monocytes Absolute: 0.4 10*3/uL (ref 0.1–1.0)
Monocytes Relative: 7.1 % (ref 3.0–12.0)
Neutro Abs: 2.6 10*3/uL (ref 1.4–7.7)
Neutrophils Relative %: 46.3 % (ref 43.0–77.0)
Platelets: 266 10*3/uL (ref 150.0–400.0)
RBC: 4.24 Mil/uL (ref 3.87–5.11)
RDW: 12.9 % (ref 11.5–15.5)
WBC: 5.6 10*3/uL (ref 4.0–10.5)

## 2021-03-29 LAB — T4, FREE: Free T4: 1 ng/dL (ref 0.60–1.60)

## 2021-03-29 LAB — TSH: TSH: 6.47 u[IU]/mL — ABNORMAL HIGH (ref 0.35–4.50)

## 2021-03-29 LAB — T3, FREE: T3, Free: 3.2 pg/mL (ref 2.3–4.2)

## 2021-03-29 MED ORDER — GABAPENTIN 300 MG PO CAPS: 300.0000 mg | ORAL_CAPSULE | Freq: Two times a day (BID) | ORAL | 3 refills | Status: DC

## 2021-03-29 NOTE — Assessment & Plan Note (Signed)
Chronic Having increased stress recently She is working on decreasing her stress is much as possible, but some of it is related to special circumstances She does feel that her medication is adequate and we will continue paroxetine 10 mg daily Discussed other ways of helping to reduce her stress

## 2021-03-29 NOTE — Assessment & Plan Note (Signed)
Chronic Clinically hypothyroid-this could be causing many of her symptoms Currently taking levothyroxine 50 mcg daily Check TFTs-we will adjust medication dose if needed

## 2021-03-29 NOTE — Patient Instructions (Addendum)
  Blood work was ordered.     Medications changes include :  none   Your prescription(s) have been submitted to your pharmacy. Please take as directed and contact our office if you believe you are having problem(s) with the medication(s).    

## 2021-03-29 NOTE — Assessment & Plan Note (Signed)
Acute Experiencing increased fatigue Also feeling hot and cold and some hair loss Possibly her thyroid Will also check CBC, CMP to rule out other causes Discussed decreasing stress Encouraged her to work on her sleep, continue to eat healthy and increase her exercise Further evaluation depending on blood work results

## 2021-03-30 DIAGNOSIS — M5459 Other low back pain: Secondary | ICD-10-CM | POA: Diagnosis not present

## 2021-03-30 MED ORDER — LEVOTHYROXINE SODIUM 75 MCG PO TABS: 75.0000 ug | ORAL_TABLET | Freq: Every day | ORAL | 3 refills | Status: DC

## 2021-03-30 NOTE — Addendum Note (Signed)
Addended by: Pincus Sanes on: 03/30/2021 06:03 AM   Modules accepted: Orders

## 2021-04-01 DIAGNOSIS — M5459 Other low back pain: Secondary | ICD-10-CM | POA: Diagnosis not present

## 2021-04-05 DIAGNOSIS — M5459 Other low back pain: Secondary | ICD-10-CM | POA: Diagnosis not present

## 2021-04-11 DIAGNOSIS — M5459 Other low back pain: Secondary | ICD-10-CM | POA: Diagnosis not present

## 2021-04-14 DIAGNOSIS — M5459 Other low back pain: Secondary | ICD-10-CM | POA: Diagnosis not present

## 2021-04-20 DIAGNOSIS — M5459 Other low back pain: Secondary | ICD-10-CM | POA: Diagnosis not present

## 2021-04-27 DIAGNOSIS — M5459 Other low back pain: Secondary | ICD-10-CM | POA: Diagnosis not present

## 2021-05-10 DIAGNOSIS — M47816 Spondylosis without myelopathy or radiculopathy, lumbar region: Secondary | ICD-10-CM | POA: Diagnosis not present

## 2021-05-11 ENCOUNTER — Other Ambulatory Visit: Payer: Self-pay

## 2021-05-11 ENCOUNTER — Other Ambulatory Visit (INDEPENDENT_AMBULATORY_CARE_PROVIDER_SITE_OTHER): Payer: BC Managed Care – PPO

## 2021-05-11 DIAGNOSIS — E039 Hypothyroidism, unspecified: Secondary | ICD-10-CM

## 2021-05-11 LAB — TSH: TSH: 0.8 u[IU]/mL (ref 0.35–5.50)

## 2021-07-11 ENCOUNTER — Ambulatory Visit: Payer: BC Managed Care – PPO | Admitting: Internal Medicine

## 2021-07-13 NOTE — Progress Notes (Signed)
Subjective:    Patient ID: Sharon Stephenson, female    DOB: 01-24-1961, 60 y.o.   MRN: [id number]  This visit occurred during the SARS-CoV-2 public health emergency.  Safety protocols were in place, including screening questions prior to the visit, additional usage of staff PPE, and extensive cleaning of exam room while observing appropriate contact time as indicated for disinfecting solutions.     HPI The patient is here for follow up of their chronic medical problems, including hypothyroidism, anxiety, polyneuropathy  She is not exercising regularly.  Energy level is not going on.   She is gardening.  She has a lot of stress still with her mom in [place], her dog is sick and still getting the house remodeled.    Medications and allergies reviewed with patient and updated if appropriate.  Patient Active Problem List   Diagnosis Date Noted   Fatigue 03/29/2021   Allergic rhinitis 11/27/2017   GERD (gastroesophageal reflux disease) 11/12/2017   Right calf pain 11/12/2017   Abdominal pain, epigastric 05/26/2016   Small fiber polyneuropathy (HCC) 11/23/2014   Osteopenia 01/05/2014   Lumbago without sciatica 02/14/2012   Sciatica 04/20/2011   CERVICALGIA 10/10/2010   Anxiety state 06/20/2007   Hypothyroidism 06/17/2007    Current Outpatient Medications on File Prior to Visit  Medication Sig Dispense Refill   ALPHA LIPOIC ACID PO Take by mouth.     Ascorbic Acid (VITAMIN C) 1000 MG tablet Take 1,000 mg by mouth daily.     Biotin 5 MG CAPS Take by mouth.     Calcium-Magnesium-Vitamin D (CALCIUM MAGNESIUM PO) Take 1,000 mg by mouth.     COLLAGEN PO Take by mouth.     Evening Primrose Oil 500 MG CAPS Take 1 capsule by mouth daily.     Fish Oil-Cholecalciferol (OMEGA-3 FISH OIL-VITAMIN D3) 1200-1000 MG-UNIT CAPS Take by mouth daily.     gabapentin (NEURONTIN) 300 MG capsule Take 1 capsule (300 mg total) by mouth 2 (two) times daily. 180 capsule 3   ketoconazole (NIZORAL) 2 % cream  Apply 1 application topically as needed for irritation.     levothyroxine (SYNTHROID) 75 MCG tablet Take 1 tablet (75 mcg total) by mouth daily. 90 tablet 3   Magnesium 200 MG TABS Take by mouth daily.     NON FORMULARY Zincl supplement ovc as needed     NON FORMULARY Iron supplement take once a day     PARoxetine (PAXIL) 10 MG tablet Take 1 tablet (10 mg total) by mouth daily. 90 tablet 3   Probiotic Product (PROBIOTIC FORMULA) CAPS Take by mouth daily.     Turmeric 500 MG CAPS Take by mouth daily.     No current facility-administered medications on file prior to visit.    Past Medical History:  Diagnosis Date   ANEMIA, OTHER UNSPEC    ANXIETY    Arthritis    joints   Cervicalgia    chronic - follows with neuro in [place]   DEPRESSION    HYPOTHYROIDISM    Lumbar radicular syndrome spring 2013   Osteopenia 01/05/2014   DEXA @ [facility] 12/2013: -1.2   Small fiber polyneuropathy 11/2014   Dr. Asa Lente    Past Surgical History:  Procedure Laterality Date   BREAST SURGERY     Breast reduction   COLONOSCOPY     TONSILLECTOMY      Social History   Socioeconomic History   Marital status: Married    Spouse name:  Not on file   Number of children: Not on file   Years of education: Not on file   Highest education level: Not on file  Occupational History   Not on file  Tobacco Use   Smoking status: Former    Types: Cigarettes    Quit date: 10/23/1985    Years since quitting: 35.7   Smokeless tobacco: Never  Substance and Sexual Activity   Alcohol use: Yes    Alcohol/week: 0.0 standard drinks   Drug use: No   Sexual activity: Not on file  Other Topics Concern   Not on file  Social History Narrative   Married, lives with spouse; no children. Originally from [place] Strain: Not on [employer] Insecurity: Not on file  Transportation Needs: Not on file  Physical Activity: Not on file  Stress: Not on file  Social  Connections: Not on file    Family History  Problem Relation Age of Onset   Kidney disease Father    Colon cancer Paternal Grandmother     Review of Systems  Constitutional:  Positive for fatigue. Negative for chills and fever.  Respiratory:  Negative for cough, shortness of breath and wheezing.   Neurological:  Positive for dizziness (a little - likely from not eating). Negative for light-headedness and headaches.      Objective:   Vitals:   07/14/21 1014  BP: 108/78  Pulse: 72  Temp: 98 F (36.7 C)  SpO2: 98%   BP Readings from Last 3 Encounters:  07/14/21 108/78  03/29/21 104/60  12/27/20 100/64   Wt Readings from Last 3 Encounters:  07/14/21 122 lb (55.3 kg)  03/29/21 119 lb 6.4 oz (54.2 kg)  12/27/20 118 lb (53.5 kg)   Body mass index is 23.05 kg/m.   Physical Exam    Constitutional: Appears well-developed and well-nourished. No distress.  HENT:  Head: Normocephalic and atraumatic.  Neck: Neck supple. No tracheal deviation present. No thyromegaly present.  No cervical lymphadenopathy Cardiovascular: Normal rate, regular rhythm and normal heart sounds.   No murmur heard. No carotid bruit .  No edema Pulmonary/Chest: Effort normal and breath sounds normal. No respiratory distress. No has no wheezes. No rales.  Skin: Skin is warm and dry. Not diaphoretic.  Psychiatric: Normal mood and affect. Behavior is normal.      Assessment & Plan:    See Problem List for Assessment and Plan of chronic medical problems.

## 2021-07-13 NOTE — Patient Instructions (Addendum)
    Blood work was ordered.      Medications changes include :   none     Please followup in 6 months  

## 2021-07-14 ENCOUNTER — Encounter: Payer: Self-pay | Admitting: Internal Medicine

## 2021-07-14 ENCOUNTER — Ambulatory Visit (INDEPENDENT_AMBULATORY_CARE_PROVIDER_SITE_OTHER): Payer: BC Managed Care – PPO | Admitting: Internal Medicine

## 2021-07-14 ENCOUNTER — Other Ambulatory Visit: Payer: Self-pay

## 2021-07-14 VITALS — BP 108/78 | HR 72 | Temp 98.0°F | Ht 61.0 in | Wt 122.0 lb

## 2021-07-14 DIAGNOSIS — F411 Generalized anxiety disorder: Secondary | ICD-10-CM | POA: Diagnosis not present

## 2021-07-14 DIAGNOSIS — G6289 Other specified polyneuropathies: Secondary | ICD-10-CM

## 2021-07-14 DIAGNOSIS — Z13220 Encounter for screening for lipoid disorders: Secondary | ICD-10-CM | POA: Diagnosis not present

## 2021-07-14 DIAGNOSIS — E039 Hypothyroidism, unspecified: Secondary | ICD-10-CM

## 2021-07-14 LAB — COMPREHENSIVE METABOLIC PANEL
ALT: 40 U/L — ABNORMAL HIGH (ref 0–35)
AST: 38 U/L — ABNORMAL HIGH (ref 0–37)
Albumin: 4.3 g/dL (ref 3.5–5.2)
Alkaline Phosphatase: 65 U/L (ref 39–117)
BUN: 18 mg/dL (ref 6–23)
CO2: 31 mEq/L (ref 19–32)
Calcium: 9.1 mg/dL (ref 8.4–10.5)
Chloride: 104 mEq/L (ref 96–112)
Creatinine, Ser: 0.65 mg/dL (ref 0.40–1.20)
GFR: 95.33 mL/min (ref >60.00)
Glucose, Bld: 98 mg/dL (ref 70–99)
Potassium: 3.8 mEq/L (ref 3.5–5.1)
Sodium: 141 mEq/L (ref 135–145)
Total Bilirubin: 1.3 mg/dL — ABNORMAL HIGH (ref 0.2–1.2)
Total Protein: 7.3 g/dL (ref 6.0–8.3)

## 2021-07-14 LAB — LIPID PANEL
Cholesterol: 197 mg/dL (ref 0–200)
HDL: 66.8 mg/dL (ref >39.00)
LDL Cholesterol: 116 mg/dL — ABNORMAL HIGH (ref 0–99)
NonHDL: 130.18
Total CHOL/HDL Ratio: 3
Triglycerides: 70 mg/dL (ref 0.0–149.0)
VLDL: 14 mg/dL (ref 0.0–40.0)

## 2021-07-14 LAB — TSH: TSH: 0.34 u[IU]/mL — ABNORMAL LOW (ref 0.35–5.50)

## 2021-07-14 NOTE — Assessment & Plan Note (Signed)
Chronic  Fatigued - ? Related to thyroid or not Currently taking levothyroxine 75 mcg qd Check tsh  Titrate med dose if needed

## 2021-07-14 NOTE — Assessment & Plan Note (Signed)
Chronic Controlled, stable Continue paxil 10 mg daily  

## 2021-07-14 NOTE — Assessment & Plan Note (Signed)
Chronic Controlled Typically taking gabapentin 300 mg bid and that is working well - continue  -- can increase if needed Start regular exercise

## 2021-09-07 DIAGNOSIS — L218 Other seborrheic dermatitis: Secondary | ICD-10-CM | POA: Diagnosis not present

## 2021-09-19 DIAGNOSIS — M79642 Pain in left hand: Secondary | ICD-10-CM | POA: Diagnosis not present

## 2021-09-26 DIAGNOSIS — S5002XA Contusion of left elbow, initial encounter: Secondary | ICD-10-CM | POA: Diagnosis not present

## 2021-09-26 DIAGNOSIS — S60212A Contusion of left wrist, initial encounter: Secondary | ICD-10-CM | POA: Diagnosis not present

## 2021-09-26 DIAGNOSIS — M79642 Pain in left hand: Secondary | ICD-10-CM | POA: Diagnosis not present

## 2021-11-02 DIAGNOSIS — S52132A Displaced fracture of neck of left radius, initial encounter for closed fracture: Secondary | ICD-10-CM | POA: Diagnosis not present

## 2021-11-02 DIAGNOSIS — M13842 Other specified arthritis, left hand: Secondary | ICD-10-CM | POA: Diagnosis not present

## 2021-11-02 DIAGNOSIS — S60212A Contusion of left wrist, initial encounter: Secondary | ICD-10-CM | POA: Diagnosis not present

## 2021-11-02 DIAGNOSIS — S5002XA Contusion of left elbow, initial encounter: Secondary | ICD-10-CM | POA: Diagnosis not present

## 2021-12-07 NOTE — Progress Notes (Signed)
Subjective:    Patient ID: Sharon Stephenson, female    DOB: [birth year], 61 y.o.   MRN: [id number]  This visit occurred during the SARS-CoV-2 public health emergency.  Safety protocols were in place, including screening questions prior to the visit, additional usage of staff PPE, and extensive cleaning of exam room while observing appropriate contact time as indicated for disinfecting solutions.    HPI The patient is here for a follow up of her thyroid and peripheral neuropathy.   Has dermatitis on face which is contributing to her feeling down.  She is seeing derm and using different lotions which are not working well.  She is going to have allergy testing done.   Has been extremely tired recently   she fell a couple of times recently - she injured her wrist with one of the falls and is seeing ortho.    She wonders if she needs to see a therapist.     Medications and allergies reviewed with patient and updated if appropriate.  Patient Active Problem List   Diagnosis Date Noted   Fatigue 03/29/2021   Allergic rhinitis 11/27/2017   GERD (gastroesophageal reflux disease) 11/12/2017   Right calf pain 11/12/2017   Abdominal pain, epigastric 05/26/2016   Small fiber polyneuropathy ([place]) 11/23/2014   Osteopenia 01/05/2014   Lumbago without sciatica 02/14/2012   Sciatica 04/20/2011   CERVICALGIA 10/10/2010   Anxiety state 06/20/2007   Hypothyroidism 06/17/2007   Depression 06/17/2007    Current Outpatient Medications on File Prior to Visit  Medication Sig Dispense Refill   ALPHA LIPOIC ACID PO Take by mouth.     Ascorbic Acid (VITAMIN C) 1000 MG tablet Take 1,000 mg by mouth daily.     Biotin 5 MG CAPS Take by mouth.     Calcium-Magnesium-Vitamin D (CALCIUM MAGNESIUM PO) Take 1,000 mg by mouth.     COLLAGEN PO Take by mouth.     Evening Primrose Oil 500 MG CAPS Take 1 capsule by mouth daily.     Fish Oil-Cholecalciferol (OMEGA-3 FISH OIL-VITAMIN D3) 1200-1000 MG-UNIT CAPS Take by  mouth daily.     gabapentin (NEURONTIN) 300 MG capsule Take 1 capsule (300 mg total) by mouth 2 (two) times daily. 180 capsule 3   levothyroxine (SYNTHROID) 75 MCG tablet Take 1 tablet (75 mcg total) by mouth daily. 90 tablet 3   Magnesium 200 MG TABS Take by mouth daily.     NON FORMULARY Zincl supplement ovc as needed     NON FORMULARY Iron supplement take once a day     Probiotic Product (PROBIOTIC FORMULA) CAPS Take by mouth daily.     triamcinolone (KENALOG) 0.025 % cream Apply 1 application topically 2 (two) times daily.     Turmeric 500 MG CAPS Take by mouth daily.     No current facility-administered medications on file prior to visit.    Past Medical History:  Diagnosis Date   ANEMIA, OTHER UNSPEC    ANXIETY    Arthritis    joints   Cervicalgia    chronic - follows with neuro in [place]   DEPRESSION    HYPOTHYROIDISM    Lumbar radicular syndrome spring 2013   Osteopenia 01/05/2014   DEXA @ [facility] 12/2013: -1.2   Small fiber polyneuropathy 11/2014   Dr. Conrad Moonshine    Past Surgical History:  Procedure Laterality Date   BREAST SURGERY     Breast reduction   COLONOSCOPY     TONSILLECTOMY  Social History   Socioeconomic History   Marital status: Married    Spouse name: Not on file   Number of children: Not on file   Years of education: Not on file   Highest education level: Not on file  Occupational History   Not on file  Tobacco Use   Smoking status: Former    Types: Cigarettes    Quit date: 10/23/1985    Years since quitting: 36.1   Smokeless tobacco: Never  Substance and Sexual Activity   Alcohol use: Yes    Alcohol/week: 0.0 standard drinks   Drug use: No   Sexual activity: Not on file  Other Topics Concern   Not on file  Social History Narrative   Married, lives with spouse; no children. Originally from [place] Strain: Not on [employer] Insecurity: Not on file  Transportation Needs: Not on file   Physical Activity: Not on file  Stress: Not on file  Social Connections: Not on file    Family History  Problem Relation Age of Onset   Kidney disease Father    Colon cancer Paternal Grandmother     Review of Systems  Constitutional:  Negative for fever.  Respiratory:  Negative for shortness of breath.   Cardiovascular:  Negative for chest pain and palpitations.  Neurological:  Positive for headaches (occ sinus). Negative for light-headedness.  Psychiatric/Behavioral:  Positive for dysphoric mood and sleep disturbance. The patient is nervous/anxious.       Objective:   Vitals:   12/08/21 0800  BP: 110/74  Pulse: 75  Temp: 98.1 F (36.7 C)  SpO2: 98%   BP Readings from Last 3 Encounters:  12/08/21 110/74  07/14/21 108/78  03/29/21 104/60   Wt Readings from Last 3 Encounters:  12/08/21 124 lb 3.2 oz (56.3 kg)  07/14/21 122 lb (55.3 kg)  03/29/21 119 lb 6.4 oz (54.2 kg)   Body mass index is 23.47 kg/m.   Physical Exam Constitutional:      General: She is not in acute distress.    Appearance: Normal appearance. She is not ill-appearing.  HENT:     Head: Normocephalic and atraumatic.  Cardiovascular:     Rate and Rhythm: Normal rate and regular rhythm.     Heart sounds: No murmur heard. Pulmonary:     Effort: Pulmonary effort is normal. No respiratory distress.     Breath sounds: Normal breath sounds. No wheezing or rales.  Musculoskeletal:     Right lower leg: No edema.     Left lower leg: No edema.  Skin:    General: Skin is warm and dry.  Neurological:     Mental Status: She is alert.           Assessment & Plan:    See Problem List for Assessment and Plan of chronic medical problems.

## 2021-12-08 ENCOUNTER — Ambulatory Visit (INDEPENDENT_AMBULATORY_CARE_PROVIDER_SITE_OTHER): Payer: BC Managed Care – PPO | Admitting: Internal Medicine

## 2021-12-08 ENCOUNTER — Other Ambulatory Visit: Payer: Self-pay

## 2021-12-08 ENCOUNTER — Encounter: Payer: Self-pay | Admitting: Internal Medicine

## 2021-12-08 VITALS — BP 110/74 | HR 75 | Temp 98.1°F | Ht 61.0 in | Wt 124.2 lb

## 2021-12-08 DIAGNOSIS — E039 Hypothyroidism, unspecified: Secondary | ICD-10-CM | POA: Diagnosis not present

## 2021-12-08 DIAGNOSIS — F3289 Other specified depressive episodes: Secondary | ICD-10-CM

## 2021-12-08 DIAGNOSIS — F411 Generalized anxiety disorder: Secondary | ICD-10-CM

## 2021-12-08 DIAGNOSIS — G6289 Other specified polyneuropathies: Secondary | ICD-10-CM

## 2021-12-08 LAB — T4, FREE: Free T4: 1.13 ng/dL (ref 0.60–1.60)

## 2021-12-08 LAB — TSH: TSH: 0.84 u[IU]/mL (ref 0.35–5.50)

## 2021-12-08 LAB — T3, FREE: T3, Free: 3.3 pg/mL (ref 2.3–4.2)

## 2021-12-08 MED ORDER — PAROXETINE HCL 20 MG PO TABS: 20.0000 mg | ORAL_TABLET | Freq: Every day | ORAL | 1 refills | Status: DC

## 2021-12-08 NOTE — Assessment & Plan Note (Signed)
Chronic Not Controlled Increase paxil to 20 mg daily Will look into seeing a therapist

## 2021-12-08 NOTE — Patient Instructions (Addendum)
° ° °  Blood work was ordered.     Medications changes include :   increase paxil to 20 mg dialy   Your prescription(s) have been sent to your pharmacy.    Return in about 1 year (around 12/08/2022) for CPE.   Here are some therapists that you should look into:    [facility] [phone]  Dr Doran Heater - [phone] Dr Evalee Jefferson   [phone]  Kerby Moors - clinical social worker/therapist -  [phone]  [facility] [address] [phone]  [facility]  [phone]  [facility] [address] [phone] Office  [facility]            [address] [phone]

## 2021-12-08 NOTE — Assessment & Plan Note (Signed)
Chronic Controlled, stable Continue gabapentin 300 mg bid

## 2021-12-08 NOTE — Assessment & Plan Note (Signed)
Chronic °Not Controlled °Increase paxil to 20 mg daily °Will look into seeing a therapist  ° °

## 2021-12-08 NOTE — Assessment & Plan Note (Addendum)
Chronic  Currently taking levothyroxine 75 mcg daily Check tsh  Titrate med dose if needed  

## 2021-12-21 ENCOUNTER — Other Ambulatory Visit: Payer: Self-pay | Admitting: Internal Medicine

## 2022-01-02 DIAGNOSIS — J3089 Other allergic rhinitis: Secondary | ICD-10-CM | POA: Diagnosis not present

## 2022-01-04 DIAGNOSIS — L218 Other seborrheic dermatitis: Secondary | ICD-10-CM | POA: Diagnosis not present

## 2022-01-16 DIAGNOSIS — J301 Allergic rhinitis due to pollen: Secondary | ICD-10-CM | POA: Diagnosis not present

## 2022-01-17 DIAGNOSIS — J3089 Other allergic rhinitis: Secondary | ICD-10-CM | POA: Diagnosis not present

## 2022-01-17 DIAGNOSIS — J3081 Allergic rhinitis due to animal (cat) (dog) hair and dander: Secondary | ICD-10-CM | POA: Diagnosis not present

## 2022-02-13 DIAGNOSIS — J3081 Allergic rhinitis due to animal (cat) (dog) hair and dander: Secondary | ICD-10-CM | POA: Diagnosis not present

## 2022-02-13 DIAGNOSIS — J301 Allergic rhinitis due to pollen: Secondary | ICD-10-CM | POA: Diagnosis not present

## 2022-02-13 DIAGNOSIS — J3089 Other allergic rhinitis: Secondary | ICD-10-CM | POA: Diagnosis not present

## 2022-02-15 DIAGNOSIS — J3081 Allergic rhinitis due to animal (cat) (dog) hair and dander: Secondary | ICD-10-CM | POA: Diagnosis not present

## 2022-02-15 DIAGNOSIS — I8311 Varicose veins of right lower extremity with inflammation: Secondary | ICD-10-CM | POA: Diagnosis not present

## 2022-02-15 DIAGNOSIS — J301 Allergic rhinitis due to pollen: Secondary | ICD-10-CM | POA: Diagnosis not present

## 2022-02-15 DIAGNOSIS — J3089 Other allergic rhinitis: Secondary | ICD-10-CM | POA: Diagnosis not present

## 2022-02-15 DIAGNOSIS — I8312 Varicose veins of left lower extremity with inflammation: Secondary | ICD-10-CM | POA: Diagnosis not present

## 2022-02-17 DIAGNOSIS — I8311 Varicose veins of right lower extremity with inflammation: Secondary | ICD-10-CM | POA: Diagnosis not present

## 2022-02-17 DIAGNOSIS — I8312 Varicose veins of left lower extremity with inflammation: Secondary | ICD-10-CM | POA: Diagnosis not present

## 2022-02-22 DIAGNOSIS — I8311 Varicose veins of right lower extremity with inflammation: Secondary | ICD-10-CM | POA: Diagnosis not present

## 2022-02-22 DIAGNOSIS — I8312 Varicose veins of left lower extremity with inflammation: Secondary | ICD-10-CM | POA: Diagnosis not present

## 2022-02-24 ENCOUNTER — Encounter: Payer: Self-pay | Admitting: Internal Medicine

## 2022-02-24 DIAGNOSIS — I8393 Asymptomatic varicose veins of bilateral lower extremities: Secondary | ICD-10-CM | POA: Insufficient documentation

## 2022-03-21 ENCOUNTER — Other Ambulatory Visit: Payer: Self-pay | Admitting: Internal Medicine

## 2022-03-22 ENCOUNTER — Other Ambulatory Visit: Payer: Self-pay | Admitting: Internal Medicine

## 2022-03-23 ENCOUNTER — Other Ambulatory Visit: Payer: Self-pay

## 2022-03-28 DIAGNOSIS — J301 Allergic rhinitis due to pollen: Secondary | ICD-10-CM | POA: Diagnosis not present

## 2022-03-28 DIAGNOSIS — J3081 Allergic rhinitis due to animal (cat) (dog) hair and dander: Secondary | ICD-10-CM | POA: Diagnosis not present

## 2022-03-28 DIAGNOSIS — J3089 Other allergic rhinitis: Secondary | ICD-10-CM | POA: Diagnosis not present

## 2022-03-30 DIAGNOSIS — J3081 Allergic rhinitis due to animal (cat) (dog) hair and dander: Secondary | ICD-10-CM | POA: Diagnosis not present

## 2022-03-30 DIAGNOSIS — J3089 Other allergic rhinitis: Secondary | ICD-10-CM | POA: Diagnosis not present

## 2022-03-30 DIAGNOSIS — J301 Allergic rhinitis due to pollen: Secondary | ICD-10-CM | POA: Diagnosis not present

## 2022-04-04 DIAGNOSIS — J301 Allergic rhinitis due to pollen: Secondary | ICD-10-CM | POA: Diagnosis not present

## 2022-04-04 DIAGNOSIS — J3081 Allergic rhinitis due to animal (cat) (dog) hair and dander: Secondary | ICD-10-CM | POA: Diagnosis not present

## 2022-04-04 DIAGNOSIS — J3089 Other allergic rhinitis: Secondary | ICD-10-CM | POA: Diagnosis not present

## 2022-04-06 DIAGNOSIS — J301 Allergic rhinitis due to pollen: Secondary | ICD-10-CM | POA: Diagnosis not present

## 2022-04-06 DIAGNOSIS — J3089 Other allergic rhinitis: Secondary | ICD-10-CM | POA: Diagnosis not present

## 2022-04-06 DIAGNOSIS — J3081 Allergic rhinitis due to animal (cat) (dog) hair and dander: Secondary | ICD-10-CM | POA: Diagnosis not present

## 2022-04-11 DIAGNOSIS — J3081 Allergic rhinitis due to animal (cat) (dog) hair and dander: Secondary | ICD-10-CM | POA: Diagnosis not present

## 2022-04-11 DIAGNOSIS — J3089 Other allergic rhinitis: Secondary | ICD-10-CM | POA: Diagnosis not present

## 2022-04-11 DIAGNOSIS — J301 Allergic rhinitis due to pollen: Secondary | ICD-10-CM | POA: Diagnosis not present

## 2022-04-14 DIAGNOSIS — J3089 Other allergic rhinitis: Secondary | ICD-10-CM | POA: Diagnosis not present

## 2022-04-14 DIAGNOSIS — J3081 Allergic rhinitis due to animal (cat) (dog) hair and dander: Secondary | ICD-10-CM | POA: Diagnosis not present

## 2022-04-14 DIAGNOSIS — J301 Allergic rhinitis due to pollen: Secondary | ICD-10-CM | POA: Diagnosis not present

## 2022-04-17 DIAGNOSIS — J301 Allergic rhinitis due to pollen: Secondary | ICD-10-CM | POA: Diagnosis not present

## 2022-04-17 DIAGNOSIS — J3089 Other allergic rhinitis: Secondary | ICD-10-CM | POA: Diagnosis not present

## 2022-04-17 DIAGNOSIS — J3081 Allergic rhinitis due to animal (cat) (dog) hair and dander: Secondary | ICD-10-CM | POA: Diagnosis not present

## 2022-04-19 DIAGNOSIS — J3081 Allergic rhinitis due to animal (cat) (dog) hair and dander: Secondary | ICD-10-CM | POA: Diagnosis not present

## 2022-04-19 DIAGNOSIS — J3089 Other allergic rhinitis: Secondary | ICD-10-CM | POA: Diagnosis not present

## 2022-04-19 DIAGNOSIS — J301 Allergic rhinitis due to pollen: Secondary | ICD-10-CM | POA: Diagnosis not present

## 2022-04-21 DIAGNOSIS — J301 Allergic rhinitis due to pollen: Secondary | ICD-10-CM | POA: Diagnosis not present

## 2022-04-21 DIAGNOSIS — J3081 Allergic rhinitis due to animal (cat) (dog) hair and dander: Secondary | ICD-10-CM | POA: Diagnosis not present

## 2022-04-21 DIAGNOSIS — J3089 Other allergic rhinitis: Secondary | ICD-10-CM | POA: Diagnosis not present

## 2022-04-26 DIAGNOSIS — J3081 Allergic rhinitis due to animal (cat) (dog) hair and dander: Secondary | ICD-10-CM | POA: Diagnosis not present

## 2022-04-26 DIAGNOSIS — J301 Allergic rhinitis due to pollen: Secondary | ICD-10-CM | POA: Diagnosis not present

## 2022-04-26 DIAGNOSIS — J3089 Other allergic rhinitis: Secondary | ICD-10-CM | POA: Diagnosis not present

## 2022-04-28 DIAGNOSIS — J3089 Other allergic rhinitis: Secondary | ICD-10-CM | POA: Diagnosis not present

## 2022-04-28 DIAGNOSIS — J301 Allergic rhinitis due to pollen: Secondary | ICD-10-CM | POA: Diagnosis not present

## 2022-04-28 DIAGNOSIS — J3081 Allergic rhinitis due to animal (cat) (dog) hair and dander: Secondary | ICD-10-CM | POA: Diagnosis not present

## 2022-05-02 DIAGNOSIS — J3081 Allergic rhinitis due to animal (cat) (dog) hair and dander: Secondary | ICD-10-CM | POA: Diagnosis not present

## 2022-05-02 DIAGNOSIS — J3089 Other allergic rhinitis: Secondary | ICD-10-CM | POA: Diagnosis not present

## 2022-05-02 DIAGNOSIS — J301 Allergic rhinitis due to pollen: Secondary | ICD-10-CM | POA: Diagnosis not present

## 2022-05-05 DIAGNOSIS — J3089 Other allergic rhinitis: Secondary | ICD-10-CM | POA: Diagnosis not present

## 2022-05-05 DIAGNOSIS — J301 Allergic rhinitis due to pollen: Secondary | ICD-10-CM | POA: Diagnosis not present

## 2022-05-05 DIAGNOSIS — J3081 Allergic rhinitis due to animal (cat) (dog) hair and dander: Secondary | ICD-10-CM | POA: Diagnosis not present

## 2022-05-09 DIAGNOSIS — J3081 Allergic rhinitis due to animal (cat) (dog) hair and dander: Secondary | ICD-10-CM | POA: Diagnosis not present

## 2022-05-09 DIAGNOSIS — J301 Allergic rhinitis due to pollen: Secondary | ICD-10-CM | POA: Diagnosis not present

## 2022-05-09 DIAGNOSIS — J3089 Other allergic rhinitis: Secondary | ICD-10-CM | POA: Diagnosis not present

## 2022-05-12 DIAGNOSIS — J3089 Other allergic rhinitis: Secondary | ICD-10-CM | POA: Diagnosis not present

## 2022-05-12 DIAGNOSIS — J301 Allergic rhinitis due to pollen: Secondary | ICD-10-CM | POA: Diagnosis not present

## 2022-05-12 DIAGNOSIS — J3081 Allergic rhinitis due to animal (cat) (dog) hair and dander: Secondary | ICD-10-CM | POA: Diagnosis not present

## 2022-05-15 DIAGNOSIS — J301 Allergic rhinitis due to pollen: Secondary | ICD-10-CM | POA: Diagnosis not present

## 2022-05-15 DIAGNOSIS — J3089 Other allergic rhinitis: Secondary | ICD-10-CM | POA: Diagnosis not present

## 2022-05-15 DIAGNOSIS — J3081 Allergic rhinitis due to animal (cat) (dog) hair and dander: Secondary | ICD-10-CM | POA: Diagnosis not present

## 2022-05-24 NOTE — Patient Instructions (Addendum)
     Blood work was ordered.     Medications changes include :   none      Return in about 6 months (around 11/25/2022) for Physical Exam.

## 2022-05-24 NOTE — Progress Notes (Unsigned)
    Subjective:    Patient ID: Sharon Stephenson, female    DOB: 1961/04/08, 61 y.o.   MRN: [id number]      HPI Sharon Stephenson is here for No chief complaint on file.   Fatigue, thyroid concerns: she is taking her thyroid medication daily as prescribed.     Medications and allergies reviewed with patient and updated if appropriate.  Current Outpatient Medications on File Prior to Visit  Medication Sig Dispense Refill   ALPHA LIPOIC ACID PO Take by mouth.     Ascorbic Acid (VITAMIN C) 1000 MG tablet Take 1,000 mg by mouth daily.     Biotin 5 MG CAPS Take by mouth.     Calcium-Magnesium-Vitamin D (CALCIUM MAGNESIUM PO) Take 1,000 mg by mouth.     COLLAGEN PO Take by mouth.     Evening Primrose Oil 500 MG CAPS Take 1 capsule by mouth daily.     Fish Oil-Cholecalciferol (OMEGA-3 FISH OIL-VITAMIN D3) 1200-1000 MG-UNIT CAPS Take by mouth daily.     gabapentin (NEURONTIN) 300 MG capsule TAKE 1 CAPSULE BY MOUTH TWICE A DAY 180 capsule 3   levothyroxine (SYNTHROID) 75 MCG tablet TAKE 1 TABLET BY MOUTH EVERY DAY 90 tablet 3   Magnesium 200 MG TABS Take by mouth daily.     NON FORMULARY Zincl supplement ovc as needed     NON FORMULARY Iron supplement take once a day     PARoxetine (PAXIL) 20 MG tablet TAKE 1 TABLET BY MOUTH EVERY DAY 90 tablet 1   Probiotic Product (PROBIOTIC FORMULA) CAPS Take by mouth daily.     triamcinolone (KENALOG) 0.025 % cream Apply 1 application topically 2 (two) times daily.     Turmeric 500 MG CAPS Take by mouth daily.     No current facility-administered medications on file prior to visit.    Review of Systems     Objective:  There were no vitals filed for this visit. BP Readings from Last 3 Encounters:  12/08/21 110/74  07/14/21 108/78  03/29/21 104/60   Wt Readings from Last 3 Encounters:  12/08/21 124 lb 3.2 oz (56.3 kg)  07/14/21 122 lb (55.3 kg)  03/29/21 119 lb 6.4 oz (54.2 kg)   There is no height or weight on file to calculate BMI.    Physical  Exam         Assessment & Plan:    See Problem List for Assessment and Plan of chronic medical problems.

## 2022-05-25 ENCOUNTER — Ambulatory Visit (INDEPENDENT_AMBULATORY_CARE_PROVIDER_SITE_OTHER): Payer: BC Managed Care – PPO | Admitting: Internal Medicine

## 2022-05-25 ENCOUNTER — Encounter: Payer: Self-pay | Admitting: Internal Medicine

## 2022-05-25 VITALS — BP 118/70 | HR 65 | Temp 97.8°F | Ht 61.0 in | Wt 128.0 lb

## 2022-05-25 DIAGNOSIS — J301 Allergic rhinitis due to pollen: Secondary | ICD-10-CM | POA: Diagnosis not present

## 2022-05-25 DIAGNOSIS — F411 Generalized anxiety disorder: Secondary | ICD-10-CM

## 2022-05-25 DIAGNOSIS — R5383 Other fatigue: Secondary | ICD-10-CM

## 2022-05-25 DIAGNOSIS — J3081 Allergic rhinitis due to animal (cat) (dog) hair and dander: Secondary | ICD-10-CM | POA: Diagnosis not present

## 2022-05-25 DIAGNOSIS — J3089 Other allergic rhinitis: Secondary | ICD-10-CM | POA: Diagnosis not present

## 2022-05-25 DIAGNOSIS — F3289 Other specified depressive episodes: Secondary | ICD-10-CM

## 2022-05-25 DIAGNOSIS — E039 Hypothyroidism, unspecified: Secondary | ICD-10-CM

## 2022-05-25 LAB — COMPREHENSIVE METABOLIC PANEL
ALT: 22 U/L (ref 0–35)
AST: 26 U/L (ref 0–37)
Albumin: 4.7 g/dL (ref 3.5–5.2)
Alkaline Phosphatase: 75 U/L (ref 39–117)
BUN: 18 mg/dL (ref 6–23)
CO2: 31 mEq/L (ref 19–32)
Calcium: 9.7 mg/dL (ref 8.4–10.5)
Chloride: 100 mEq/L (ref 96–112)
Creatinine, Ser: 0.6 mg/dL (ref 0.40–1.20)
GFR: 96.6 mL/min (ref >60.00)
Glucose, Bld: 96 mg/dL (ref 70–99)
Potassium: 3.6 mEq/L (ref 3.5–5.1)
Sodium: 139 mEq/L (ref 135–145)
Total Bilirubin: 1.5 mg/dL — ABNORMAL HIGH (ref 0.2–1.2)
Total Protein: 8 g/dL (ref 6.0–8.3)

## 2022-05-25 LAB — CBC WITH DIFFERENTIAL/PLATELET
Basophils Absolute: 0.1 10*3/uL (ref 0.0–0.1)
Basophils Relative: 1 % (ref 0.0–3.0)
Eosinophils Absolute: 0.2 10*3/uL (ref 0.0–0.7)
Eosinophils Relative: 2.3 % (ref 0.0–5.0)
HCT: 36.9 % (ref 36.0–46.0)
Hemoglobin: 12.5 g/dL (ref 12.0–15.0)
Lymphocytes Relative: 49.4 % — ABNORMAL HIGH (ref 12.0–46.0)
Lymphs Abs: 3.3 10*3/uL (ref 0.7–4.0)
MCHC: 33.9 g/dL (ref 30.0–36.0)
MCV: 84.6 fl (ref 78.0–100.0)
Monocytes Absolute: 0.4 10*3/uL (ref 0.1–1.0)
Monocytes Relative: 6.7 % (ref 3.0–12.0)
Neutro Abs: 2.7 10*3/uL (ref 1.4–7.7)
Neutrophils Relative %: 40.6 % — ABNORMAL LOW (ref 43.0–77.0)
Platelets: 296 10*3/uL (ref 150.0–400.0)
RBC: 4.36 Mil/uL (ref 3.87–5.11)
RDW: 13.2 % (ref 11.5–15.5)
WBC: 6.6 10*3/uL (ref 4.0–10.5)

## 2022-05-25 LAB — T4, FREE: Free T4: 0.98 ng/dL (ref 0.60–1.60)

## 2022-05-25 LAB — TSH: TSH: 1.8 u[IU]/mL (ref 0.35–5.50)

## 2022-05-25 LAB — T3, FREE: T3, Free: 3.2 pg/mL (ref 2.3–4.2)

## 2022-05-25 NOTE — Assessment & Plan Note (Addendum)
States increased fatigue Will check thyroid function, cbc, cmp Continue regular exercise Getting good sleep

## 2022-05-25 NOTE — Assessment & Plan Note (Signed)
Chronic Having some weight gain and increased fatigue ? Thyroid levels off Check tsh, ft3, ft4 Will adjust meds as needed  Advised to dec carb intake/snacks

## 2022-05-25 NOTE — Assessment & Plan Note (Signed)
Chronic Controlled, stable Continue paxil 20 mg daily  

## 2022-05-26 DIAGNOSIS — M2021 Hallux rigidus, right foot: Secondary | ICD-10-CM | POA: Diagnosis not present

## 2022-05-29 ENCOUNTER — Encounter: Payer: Self-pay | Admitting: Internal Medicine

## 2022-05-30 DIAGNOSIS — J301 Allergic rhinitis due to pollen: Secondary | ICD-10-CM | POA: Diagnosis not present

## 2022-05-30 DIAGNOSIS — J3081 Allergic rhinitis due to animal (cat) (dog) hair and dander: Secondary | ICD-10-CM | POA: Diagnosis not present

## 2022-05-30 DIAGNOSIS — J3089 Other allergic rhinitis: Secondary | ICD-10-CM | POA: Diagnosis not present

## 2022-05-31 DIAGNOSIS — M7711 Lateral epicondylitis, right elbow: Secondary | ICD-10-CM | POA: Diagnosis not present

## 2022-06-07 DIAGNOSIS — J301 Allergic rhinitis due to pollen: Secondary | ICD-10-CM | POA: Diagnosis not present

## 2022-06-07 DIAGNOSIS — J3081 Allergic rhinitis due to animal (cat) (dog) hair and dander: Secondary | ICD-10-CM | POA: Diagnosis not present

## 2022-06-07 DIAGNOSIS — J3089 Other allergic rhinitis: Secondary | ICD-10-CM | POA: Diagnosis not present

## 2022-06-14 DIAGNOSIS — J301 Allergic rhinitis due to pollen: Secondary | ICD-10-CM | POA: Diagnosis not present

## 2022-06-14 DIAGNOSIS — J3089 Other allergic rhinitis: Secondary | ICD-10-CM | POA: Diagnosis not present

## 2022-06-14 DIAGNOSIS — H1045 Other chronic allergic conjunctivitis: Secondary | ICD-10-CM | POA: Diagnosis not present

## 2022-06-14 DIAGNOSIS — J3081 Allergic rhinitis due to animal (cat) (dog) hair and dander: Secondary | ICD-10-CM | POA: Diagnosis not present

## 2022-06-20 DIAGNOSIS — J3081 Allergic rhinitis due to animal (cat) (dog) hair and dander: Secondary | ICD-10-CM | POA: Diagnosis not present

## 2022-06-20 DIAGNOSIS — J301 Allergic rhinitis due to pollen: Secondary | ICD-10-CM | POA: Diagnosis not present

## 2022-06-20 DIAGNOSIS — J3089 Other allergic rhinitis: Secondary | ICD-10-CM | POA: Diagnosis not present

## 2022-06-29 DIAGNOSIS — J301 Allergic rhinitis due to pollen: Secondary | ICD-10-CM | POA: Diagnosis not present

## 2022-06-29 DIAGNOSIS — J3081 Allergic rhinitis due to animal (cat) (dog) hair and dander: Secondary | ICD-10-CM | POA: Diagnosis not present

## 2022-06-29 DIAGNOSIS — J3089 Other allergic rhinitis: Secondary | ICD-10-CM | POA: Diagnosis not present

## 2022-07-04 DIAGNOSIS — J3089 Other allergic rhinitis: Secondary | ICD-10-CM | POA: Diagnosis not present

## 2022-07-04 DIAGNOSIS — J301 Allergic rhinitis due to pollen: Secondary | ICD-10-CM | POA: Diagnosis not present

## 2022-07-04 DIAGNOSIS — J3081 Allergic rhinitis due to animal (cat) (dog) hair and dander: Secondary | ICD-10-CM | POA: Diagnosis not present

## 2022-07-11 DIAGNOSIS — J3081 Allergic rhinitis due to animal (cat) (dog) hair and dander: Secondary | ICD-10-CM | POA: Diagnosis not present

## 2022-07-11 DIAGNOSIS — J3089 Other allergic rhinitis: Secondary | ICD-10-CM | POA: Diagnosis not present

## 2022-07-11 DIAGNOSIS — J301 Allergic rhinitis due to pollen: Secondary | ICD-10-CM | POA: Diagnosis not present

## 2022-07-12 DIAGNOSIS — Z1231 Encounter for screening mammogram for malignant neoplasm of breast: Secondary | ICD-10-CM | POA: Diagnosis not present

## 2022-07-12 DIAGNOSIS — Z6823 Body mass index (BMI) 23.0-23.9, adult: Secondary | ICD-10-CM | POA: Diagnosis not present

## 2022-07-12 DIAGNOSIS — Z124 Encounter for screening for malignant neoplasm of cervix: Secondary | ICD-10-CM | POA: Diagnosis not present

## 2022-07-12 DIAGNOSIS — Z01419 Encounter for gynecological examination (general) (routine) without abnormal findings: Secondary | ICD-10-CM | POA: Diagnosis not present

## 2022-07-12 LAB — HM MAMMOGRAPHY

## 2022-07-13 ENCOUNTER — Other Ambulatory Visit: Payer: Self-pay | Admitting: Obstetrics and Gynecology

## 2022-07-13 DIAGNOSIS — M8580 Other specified disorders of bone density and structure, unspecified site: Secondary | ICD-10-CM

## 2022-07-18 DIAGNOSIS — J3081 Allergic rhinitis due to animal (cat) (dog) hair and dander: Secondary | ICD-10-CM | POA: Diagnosis not present

## 2022-07-18 DIAGNOSIS — J3089 Other allergic rhinitis: Secondary | ICD-10-CM | POA: Diagnosis not present

## 2022-07-18 DIAGNOSIS — J301 Allergic rhinitis due to pollen: Secondary | ICD-10-CM | POA: Diagnosis not present

## 2022-07-18 LAB — HM PAP SMEAR
HM Pap smear: NEGATIVE
HPV, high-risk: NEGATIVE

## 2022-07-24 DIAGNOSIS — J301 Allergic rhinitis due to pollen: Secondary | ICD-10-CM | POA: Diagnosis not present

## 2022-07-25 DIAGNOSIS — J3089 Other allergic rhinitis: Secondary | ICD-10-CM | POA: Diagnosis not present

## 2022-07-25 DIAGNOSIS — J3081 Allergic rhinitis due to animal (cat) (dog) hair and dander: Secondary | ICD-10-CM | POA: Diagnosis not present

## 2022-08-04 DIAGNOSIS — J3081 Allergic rhinitis due to animal (cat) (dog) hair and dander: Secondary | ICD-10-CM | POA: Diagnosis not present

## 2022-08-04 DIAGNOSIS — J301 Allergic rhinitis due to pollen: Secondary | ICD-10-CM | POA: Diagnosis not present

## 2022-08-04 DIAGNOSIS — J3089 Other allergic rhinitis: Secondary | ICD-10-CM | POA: Diagnosis not present

## 2022-08-10 DIAGNOSIS — J3089 Other allergic rhinitis: Secondary | ICD-10-CM | POA: Diagnosis not present

## 2022-08-10 DIAGNOSIS — J301 Allergic rhinitis due to pollen: Secondary | ICD-10-CM | POA: Diagnosis not present

## 2022-08-10 DIAGNOSIS — J3081 Allergic rhinitis due to animal (cat) (dog) hair and dander: Secondary | ICD-10-CM | POA: Diagnosis not present

## 2022-08-17 DIAGNOSIS — J301 Allergic rhinitis due to pollen: Secondary | ICD-10-CM | POA: Diagnosis not present

## 2022-08-17 DIAGNOSIS — J3089 Other allergic rhinitis: Secondary | ICD-10-CM | POA: Diagnosis not present

## 2022-08-17 DIAGNOSIS — J3081 Allergic rhinitis due to animal (cat) (dog) hair and dander: Secondary | ICD-10-CM | POA: Diagnosis not present

## 2022-08-25 DIAGNOSIS — J3081 Allergic rhinitis due to animal (cat) (dog) hair and dander: Secondary | ICD-10-CM | POA: Diagnosis not present

## 2022-08-25 DIAGNOSIS — J3089 Other allergic rhinitis: Secondary | ICD-10-CM | POA: Diagnosis not present

## 2022-08-25 DIAGNOSIS — J301 Allergic rhinitis due to pollen: Secondary | ICD-10-CM | POA: Diagnosis not present

## 2022-09-01 DIAGNOSIS — J3081 Allergic rhinitis due to animal (cat) (dog) hair and dander: Secondary | ICD-10-CM | POA: Diagnosis not present

## 2022-09-01 DIAGNOSIS — J3089 Other allergic rhinitis: Secondary | ICD-10-CM | POA: Diagnosis not present

## 2022-09-01 DIAGNOSIS — J301 Allergic rhinitis due to pollen: Secondary | ICD-10-CM | POA: Diagnosis not present

## 2022-09-07 DIAGNOSIS — J301 Allergic rhinitis due to pollen: Secondary | ICD-10-CM | POA: Diagnosis not present

## 2022-09-07 DIAGNOSIS — J3081 Allergic rhinitis due to animal (cat) (dog) hair and dander: Secondary | ICD-10-CM | POA: Diagnosis not present

## 2022-09-07 DIAGNOSIS — J3089 Other allergic rhinitis: Secondary | ICD-10-CM | POA: Diagnosis not present

## 2022-09-11 DIAGNOSIS — J301 Allergic rhinitis due to pollen: Secondary | ICD-10-CM | POA: Diagnosis not present

## 2022-09-11 DIAGNOSIS — J3089 Other allergic rhinitis: Secondary | ICD-10-CM | POA: Diagnosis not present

## 2022-09-11 DIAGNOSIS — J3081 Allergic rhinitis due to animal (cat) (dog) hair and dander: Secondary | ICD-10-CM | POA: Diagnosis not present

## 2022-09-21 DIAGNOSIS — J3081 Allergic rhinitis due to animal (cat) (dog) hair and dander: Secondary | ICD-10-CM | POA: Diagnosis not present

## 2022-09-21 DIAGNOSIS — J301 Allergic rhinitis due to pollen: Secondary | ICD-10-CM | POA: Diagnosis not present

## 2022-09-21 DIAGNOSIS — J3089 Other allergic rhinitis: Secondary | ICD-10-CM | POA: Diagnosis not present

## 2022-09-23 ENCOUNTER — Other Ambulatory Visit: Payer: Self-pay | Admitting: Internal Medicine

## 2022-09-29 DIAGNOSIS — J301 Allergic rhinitis due to pollen: Secondary | ICD-10-CM | POA: Diagnosis not present

## 2022-09-29 DIAGNOSIS — J3081 Allergic rhinitis due to animal (cat) (dog) hair and dander: Secondary | ICD-10-CM | POA: Diagnosis not present

## 2022-09-29 DIAGNOSIS — J3089 Other allergic rhinitis: Secondary | ICD-10-CM | POA: Diagnosis not present

## 2022-10-06 DIAGNOSIS — J3089 Other allergic rhinitis: Secondary | ICD-10-CM | POA: Diagnosis not present

## 2022-10-06 DIAGNOSIS — J3081 Allergic rhinitis due to animal (cat) (dog) hair and dander: Secondary | ICD-10-CM | POA: Diagnosis not present

## 2022-10-06 DIAGNOSIS — J301 Allergic rhinitis due to pollen: Secondary | ICD-10-CM | POA: Diagnosis not present

## 2022-10-20 DIAGNOSIS — J3081 Allergic rhinitis due to animal (cat) (dog) hair and dander: Secondary | ICD-10-CM | POA: Diagnosis not present

## 2022-10-20 DIAGNOSIS — J3089 Other allergic rhinitis: Secondary | ICD-10-CM | POA: Diagnosis not present

## 2022-10-20 DIAGNOSIS — J301 Allergic rhinitis due to pollen: Secondary | ICD-10-CM | POA: Diagnosis not present

## 2022-10-26 DIAGNOSIS — J3081 Allergic rhinitis due to animal (cat) (dog) hair and dander: Secondary | ICD-10-CM | POA: Diagnosis not present

## 2022-10-26 DIAGNOSIS — J301 Allergic rhinitis due to pollen: Secondary | ICD-10-CM | POA: Diagnosis not present

## 2022-10-26 DIAGNOSIS — J3089 Other allergic rhinitis: Secondary | ICD-10-CM | POA: Diagnosis not present

## 2022-11-02 DIAGNOSIS — J301 Allergic rhinitis due to pollen: Secondary | ICD-10-CM | POA: Diagnosis not present

## 2022-11-02 DIAGNOSIS — J3081 Allergic rhinitis due to animal (cat) (dog) hair and dander: Secondary | ICD-10-CM | POA: Diagnosis not present

## 2022-11-02 DIAGNOSIS — J3089 Other allergic rhinitis: Secondary | ICD-10-CM | POA: Diagnosis not present

## 2022-11-23 DIAGNOSIS — J3089 Other allergic rhinitis: Secondary | ICD-10-CM | POA: Diagnosis not present

## 2022-11-23 DIAGNOSIS — J301 Allergic rhinitis due to pollen: Secondary | ICD-10-CM | POA: Diagnosis not present

## 2022-11-23 DIAGNOSIS — J3081 Allergic rhinitis due to animal (cat) (dog) hair and dander: Secondary | ICD-10-CM | POA: Diagnosis not present

## 2022-11-29 DIAGNOSIS — M7711 Lateral epicondylitis, right elbow: Secondary | ICD-10-CM | POA: Diagnosis not present

## 2022-11-29 DIAGNOSIS — M65342 Trigger finger, left ring finger: Secondary | ICD-10-CM | POA: Diagnosis not present

## 2022-11-29 DIAGNOSIS — S52132A Displaced fracture of neck of left radius, initial encounter for closed fracture: Secondary | ICD-10-CM | POA: Diagnosis not present

## 2022-11-30 DIAGNOSIS — J3081 Allergic rhinitis due to animal (cat) (dog) hair and dander: Secondary | ICD-10-CM | POA: Diagnosis not present

## 2022-11-30 DIAGNOSIS — J301 Allergic rhinitis due to pollen: Secondary | ICD-10-CM | POA: Diagnosis not present

## 2022-11-30 DIAGNOSIS — J3089 Other allergic rhinitis: Secondary | ICD-10-CM | POA: Diagnosis not present

## 2022-12-04 ENCOUNTER — Ambulatory Visit: Payer: BC Managed Care – PPO | Admitting: Internal Medicine

## 2022-12-06 ENCOUNTER — Encounter: Payer: Self-pay | Admitting: Internal Medicine

## 2022-12-06 NOTE — Progress Notes (Unsigned)
Subjective:    Patient ID: Sharon Stephenson, female    DOB: 13-Jan-1961, 62 y.o.   MRN: [id number]     HPI Sharon Stephenson is here for follow up of her chronic medical problems, including hypothyroid, anxiety, depression, polyneuropathy  She fell last week - she thinks from the neuropathy.  She fell onto her hands and knees.  She iced her knees.  Two days later she had pain in her left rib cage area.  The pain is getting better.     She is exercising regularly.    Medications and allergies reviewed with patient and updated if appropriate.  Current Outpatient Medications on File Prior to Visit  Medication Sig Dispense Refill   ALPHA LIPOIC ACID PO Take by mouth.     Ascorbic Acid (VITAMIN C) 1000 MG tablet Take 1,000 mg by mouth daily.     Biotin 5 MG CAPS Take by mouth.     Calcium-Magnesium-Vitamin D (CALCIUM MAGNESIUM PO) Take 1,000 mg by mouth.     COLLAGEN PO Take by mouth.     EPINEPHrine 0.3 mg/0.3 mL IJ SOAJ injection AS DIRECTED INJECTION AS NEEDED 30 DAYS     Evening Primrose Oil 500 MG CAPS Take 1 capsule by mouth daily.     Fish Oil-Cholecalciferol (OMEGA-3 FISH OIL-VITAMIN D3) 1200-1000 MG-UNIT CAPS Take by mouth daily.     gabapentin (NEURONTIN) 300 MG capsule TAKE 1 CAPSULE BY MOUTH TWICE A DAY 180 capsule 3   levothyroxine (SYNTHROID) 75 MCG tablet TAKE 1 TABLET BY MOUTH EVERY DAY 90 tablet 3   Magnesium 200 MG TABS Take by mouth daily.     Multiple Vitamin (MULTIVITAMINS PO)      NON FORMULARY Zincl supplement ovc as needed     NON FORMULARY Iron supplement take once a day     PARoxetine (PAXIL) 20 MG tablet TAKE 1 TABLET BY MOUTH EVERY DAY 90 tablet 1   Probiotic Product (PROBIOTIC FORMULA) CAPS Take by mouth daily.     Turmeric 500 MG CAPS Take by mouth daily.     No current facility-administered medications on file prior to visit.     Review of Systems  Constitutional:  Negative for fever.       No change in energy level  Respiratory:  Negative for cough,  shortness of breath and wheezing.   Cardiovascular:  Negative for chest pain, palpitations and leg swelling.  Neurological:  Positive for headaches (occ HA posterior head). Negative for light-headedness.  Psychiatric/Behavioral:  Positive for sleep disturbance (not sleeping great - worrying about mom).        Objective:   Vitals:   12/07/22 0818  BP: 116/70  Pulse: 76  Temp: 98.1 F (36.7 C)  SpO2: 98%   BP Readings from Last 3 Encounters:  12/07/22 116/70  05/25/22 118/70  12/08/21 110/74   Wt Readings from Last 3 Encounters:  12/07/22 130 lb (59 kg)  05/25/22 128 lb (58.1 kg)  12/08/21 124 lb 3.2 oz (56.3 kg)   Body mass index is 24.56 kg/m.    Physical Exam Constitutional:      General: She is not in acute distress.    Appearance: Normal appearance.  HENT:     Head: Normocephalic and atraumatic.  Eyes:     Conjunctiva/sclera: Conjunctivae normal.  Cardiovascular:     Rate and Rhythm: Normal rate and regular rhythm.     Heart sounds: Normal heart sounds. No murmur heard. Pulmonary:  Effort: Pulmonary effort is normal. No respiratory distress.     Breath sounds: Normal breath sounds. No wheezing.  Musculoskeletal:     Cervical back: Neck supple.     Right lower leg: No edema.     Left lower leg: No edema.  Lymphadenopathy:     Cervical: No cervical adenopathy.  Skin:    General: Skin is warm and dry.     Findings: No rash.  Neurological:     Mental Status: She is alert. Mental status is at baseline.  Psychiatric:        Mood and Affect: Mood normal.        Behavior: Behavior normal.        Lab Results  Component Value Date   WBC 6.6 05/25/2022   HGB 12.5 05/25/2022   HCT 36.9 05/25/2022   PLT 296.0 05/25/2022   GLUCOSE 96 05/25/2022   CHOL 197 07/14/2021   TRIG 70.0 07/14/2021   HDL 66.80 07/14/2021   LDLDIRECT 105.3 08/03/2010   LDLCALC 116 (H) 07/14/2021   ALT 22 05/25/2022   AST 26 05/25/2022   NA 139 05/25/2022   K 3.6 05/25/2022    CL 100 05/25/2022   CREATININE 0.60 05/25/2022   BUN 18 05/25/2022   CO2 31 05/25/2022   TSH 1.80 05/25/2022   HGBA1C 5.1 11/19/2014     Assessment & Plan:    See Problem List for Assessment and Plan of chronic medical problems.

## 2022-12-06 NOTE — Patient Instructions (Addendum)
      [facility] Phone: [phone] to schedule your colonoscopy    Blood work was ordered.   The lab is on the first floor.    Medications changes include :   none      Return in about 6 months (around 06/07/2023) for Physical Exam.

## 2022-12-07 ENCOUNTER — Ambulatory Visit (INDEPENDENT_AMBULATORY_CARE_PROVIDER_SITE_OTHER): Payer: BC Managed Care – PPO | Admitting: Internal Medicine

## 2022-12-07 VITALS — BP 116/70 | HR 76 | Temp 98.1°F | Ht 61.0 in | Wt 130.0 lb

## 2022-12-07 DIAGNOSIS — F3289 Other specified depressive episodes: Secondary | ICD-10-CM | POA: Diagnosis not present

## 2022-12-07 DIAGNOSIS — G6289 Other specified polyneuropathies: Secondary | ICD-10-CM | POA: Diagnosis not present

## 2022-12-07 DIAGNOSIS — J3081 Allergic rhinitis due to animal (cat) (dog) hair and dander: Secondary | ICD-10-CM | POA: Diagnosis not present

## 2022-12-07 DIAGNOSIS — J301 Allergic rhinitis due to pollen: Secondary | ICD-10-CM | POA: Diagnosis not present

## 2022-12-07 DIAGNOSIS — E039 Hypothyroidism, unspecified: Secondary | ICD-10-CM | POA: Diagnosis not present

## 2022-12-07 DIAGNOSIS — F411 Generalized anxiety disorder: Secondary | ICD-10-CM | POA: Diagnosis not present

## 2022-12-07 DIAGNOSIS — J302 Other seasonal allergic rhinitis: Secondary | ICD-10-CM

## 2022-12-07 DIAGNOSIS — J3089 Other allergic rhinitis: Secondary | ICD-10-CM | POA: Diagnosis not present

## 2022-12-07 LAB — T3, FREE: T3, Free: 2.8 pg/mL (ref 2.3–4.2)

## 2022-12-07 LAB — TSH: TSH: 3.44 u[IU]/mL (ref 0.35–5.50)

## 2022-12-07 LAB — T4, FREE: Free T4: 0.82 ng/dL (ref 0.60–1.60)

## 2022-12-07 NOTE — Assessment & Plan Note (Signed)
Chronic Getting allergy shots

## 2022-12-07 NOTE — Assessment & Plan Note (Signed)
Chronic Controlled, Stable Continue Paxil 20 mg daily

## 2022-12-07 NOTE — Assessment & Plan Note (Signed)
Chronic Controlled Continue gabapentin 300 mg twice daily

## 2022-12-07 NOTE — Assessment & Plan Note (Signed)
Chronic  Clinically euthyroid Check tsh, free T3, free T4 and will titrate med dose if needed Currently taking levothyroxine 75 mcg daily

## 2022-12-07 NOTE — Assessment & Plan Note (Signed)
Chronic Controlled, Stable Continue Pepcid 20 mg daily

## 2022-12-14 DIAGNOSIS — J3089 Other allergic rhinitis: Secondary | ICD-10-CM | POA: Diagnosis not present

## 2022-12-14 DIAGNOSIS — J3081 Allergic rhinitis due to animal (cat) (dog) hair and dander: Secondary | ICD-10-CM | POA: Diagnosis not present

## 2022-12-14 DIAGNOSIS — J301 Allergic rhinitis due to pollen: Secondary | ICD-10-CM | POA: Diagnosis not present

## 2022-12-22 DIAGNOSIS — J3089 Other allergic rhinitis: Secondary | ICD-10-CM | POA: Diagnosis not present

## 2022-12-22 DIAGNOSIS — J301 Allergic rhinitis due to pollen: Secondary | ICD-10-CM | POA: Diagnosis not present

## 2022-12-22 DIAGNOSIS — J3081 Allergic rhinitis due to animal (cat) (dog) hair and dander: Secondary | ICD-10-CM | POA: Diagnosis not present

## 2022-12-29 DIAGNOSIS — J3089 Other allergic rhinitis: Secondary | ICD-10-CM | POA: Diagnosis not present

## 2022-12-29 DIAGNOSIS — J3081 Allergic rhinitis due to animal (cat) (dog) hair and dander: Secondary | ICD-10-CM | POA: Diagnosis not present

## 2022-12-29 DIAGNOSIS — J301 Allergic rhinitis due to pollen: Secondary | ICD-10-CM | POA: Diagnosis not present

## 2023-01-10 ENCOUNTER — Other Ambulatory Visit: Payer: Self-pay | Admitting: Obstetrics and Gynecology

## 2023-01-10 DIAGNOSIS — M8580 Other specified disorders of bone density and structure, unspecified site: Secondary | ICD-10-CM

## 2023-01-11 DIAGNOSIS — J3081 Allergic rhinitis due to animal (cat) (dog) hair and dander: Secondary | ICD-10-CM | POA: Diagnosis not present

## 2023-01-11 DIAGNOSIS — J301 Allergic rhinitis due to pollen: Secondary | ICD-10-CM | POA: Diagnosis not present

## 2023-01-11 DIAGNOSIS — J3089 Other allergic rhinitis: Secondary | ICD-10-CM | POA: Diagnosis not present

## 2023-01-18 ENCOUNTER — Other Ambulatory Visit: Payer: BC Managed Care – PPO

## 2023-01-18 DIAGNOSIS — J301 Allergic rhinitis due to pollen: Secondary | ICD-10-CM | POA: Diagnosis not present

## 2023-01-18 DIAGNOSIS — J3081 Allergic rhinitis due to animal (cat) (dog) hair and dander: Secondary | ICD-10-CM | POA: Diagnosis not present

## 2023-01-18 DIAGNOSIS — J3089 Other allergic rhinitis: Secondary | ICD-10-CM | POA: Diagnosis not present

## 2023-01-25 DIAGNOSIS — J301 Allergic rhinitis due to pollen: Secondary | ICD-10-CM | POA: Diagnosis not present

## 2023-01-26 DIAGNOSIS — J301 Allergic rhinitis due to pollen: Secondary | ICD-10-CM | POA: Diagnosis not present

## 2023-01-26 DIAGNOSIS — J3089 Other allergic rhinitis: Secondary | ICD-10-CM | POA: Diagnosis not present

## 2023-01-26 DIAGNOSIS — J3081 Allergic rhinitis due to animal (cat) (dog) hair and dander: Secondary | ICD-10-CM | POA: Diagnosis not present

## 2023-02-02 DIAGNOSIS — J3089 Other allergic rhinitis: Secondary | ICD-10-CM | POA: Diagnosis not present

## 2023-02-02 DIAGNOSIS — J3081 Allergic rhinitis due to animal (cat) (dog) hair and dander: Secondary | ICD-10-CM | POA: Diagnosis not present

## 2023-02-02 DIAGNOSIS — J301 Allergic rhinitis due to pollen: Secondary | ICD-10-CM | POA: Diagnosis not present

## 2023-02-12 DIAGNOSIS — M65342 Trigger finger, left ring finger: Secondary | ICD-10-CM | POA: Diagnosis not present

## 2023-02-14 DIAGNOSIS — J3081 Allergic rhinitis due to animal (cat) (dog) hair and dander: Secondary | ICD-10-CM | POA: Diagnosis not present

## 2023-02-14 DIAGNOSIS — J3089 Other allergic rhinitis: Secondary | ICD-10-CM | POA: Diagnosis not present

## 2023-02-14 DIAGNOSIS — J301 Allergic rhinitis due to pollen: Secondary | ICD-10-CM | POA: Diagnosis not present

## 2023-02-23 DIAGNOSIS — J301 Allergic rhinitis due to pollen: Secondary | ICD-10-CM | POA: Diagnosis not present

## 2023-02-23 DIAGNOSIS — J3081 Allergic rhinitis due to animal (cat) (dog) hair and dander: Secondary | ICD-10-CM | POA: Diagnosis not present

## 2023-02-23 DIAGNOSIS — J3089 Other allergic rhinitis: Secondary | ICD-10-CM | POA: Diagnosis not present

## 2023-03-02 DIAGNOSIS — J3081 Allergic rhinitis due to animal (cat) (dog) hair and dander: Secondary | ICD-10-CM | POA: Diagnosis not present

## 2023-03-02 DIAGNOSIS — J3089 Other allergic rhinitis: Secondary | ICD-10-CM | POA: Diagnosis not present

## 2023-03-02 DIAGNOSIS — J301 Allergic rhinitis due to pollen: Secondary | ICD-10-CM | POA: Diagnosis not present

## 2023-03-08 DIAGNOSIS — J3081 Allergic rhinitis due to animal (cat) (dog) hair and dander: Secondary | ICD-10-CM | POA: Diagnosis not present

## 2023-03-08 DIAGNOSIS — J3089 Other allergic rhinitis: Secondary | ICD-10-CM | POA: Diagnosis not present

## 2023-03-08 DIAGNOSIS — J301 Allergic rhinitis due to pollen: Secondary | ICD-10-CM | POA: Diagnosis not present

## 2023-03-21 DIAGNOSIS — J3081 Allergic rhinitis due to animal (cat) (dog) hair and dander: Secondary | ICD-10-CM | POA: Diagnosis not present

## 2023-03-21 DIAGNOSIS — J3089 Other allergic rhinitis: Secondary | ICD-10-CM | POA: Diagnosis not present

## 2023-03-21 DIAGNOSIS — J301 Allergic rhinitis due to pollen: Secondary | ICD-10-CM | POA: Diagnosis not present

## 2023-03-22 ENCOUNTER — Encounter: Payer: Self-pay | Admitting: Internal Medicine

## 2023-03-22 NOTE — Patient Instructions (Addendum)
      Blood work was ordered.   The lab is on the first floor.    Medications changes include :   none     Return if symptoms worsen or fail to improve.  

## 2023-03-22 NOTE — Progress Notes (Signed)
Subjective:    Patient ID: Sharon Stephenson, female    DOB: 02-15-1961, 62 y.o.   MRN: [id number]     HPI Sharon Stephenson is here for follow up of her chronic medical problems.  Chief Complaint  Patient presents with   Fatigue    Feeling very fatigued, has gained weight and wanted to have thyroid checked; mood wise she is either very happy or extremely down   Groin Pain    Right sided groin discomfort   Feeling very tired.  Not sleeping well.  Has gained weight.  Has started exercising.  She worries, gets overwhelmed a lot.  Gets 3-4 hrs - max 5 hrs.  Twice a week works on [employer]. She gardens.  Right groin area x 2 weeks - started when doing yard work.  Some pain in lateral upper right leg.  Medications and allergies reviewed with patient and updated if appropriate.  Current Outpatient Medications on File Prior to Visit  Medication Sig Dispense Refill   ALPHA LIPOIC ACID PO Take by mouth.     Ascorbic Acid (VITAMIN C) 1000 MG tablet Take 1,000 mg by mouth daily.     Biotin 5 MG CAPS Take by mouth.     Calcium-Magnesium-Vitamin D (CALCIUM MAGNESIUM PO) Take 1,000 mg by mouth.     COLLAGEN PO Take by mouth.     EPINEPHrine 0.3 mg/0.3 mL IJ SOAJ injection AS DIRECTED INJECTION AS NEEDED 30 DAYS     Evening Primrose Oil 500 MG CAPS Take 1 capsule by mouth daily.     Fish Oil-Cholecalciferol (OMEGA-3 FISH OIL-VITAMIN D3) 1200-1000 MG-UNIT CAPS Take by mouth daily.     gabapentin (NEURONTIN) 300 MG capsule TAKE 1 CAPSULE BY MOUTH TWICE A DAY 180 capsule 3   levothyroxine (SYNTHROID) 75 MCG tablet TAKE 1 TABLET BY MOUTH EVERY DAY 90 tablet 3   Magnesium 200 MG TABS Take by mouth daily.     Multiple Vitamin (MULTIVITAMINS PO)      NON FORMULARY Zincl supplement ovc as needed     NON FORMULARY Iron supplement take once a day     PARoxetine (PAXIL) 20 MG tablet TAKE 1 TABLET BY MOUTH EVERY DAY 90 tablet 1   Probiotic Product (PROBIOTIC FORMULA) CAPS Take by mouth daily.      Turmeric 500 MG CAPS Take by mouth daily.     No current facility-administered medications on file prior to visit.     Review of Systems  Constitutional:  Negative for fever.  Respiratory:  Negative for cough, shortness of breath and wheezing.   Cardiovascular:  Negative for chest pain, palpitations and leg swelling.  Musculoskeletal:        Right groin pain  Neurological:  Negative for light-headedness and headaches.  Psychiatric/Behavioral:  Positive for sleep disturbance.        Objective:   Vitals:   03/23/23 0901  BP: 108/74  Pulse: 80  Temp: 98 F (36.7 C)  SpO2: 99%   BP Readings from Last 3 Encounters:  03/23/23 108/74  12/07/22 116/70  05/25/22 118/70   Wt Readings from Last 3 Encounters:  03/23/23 128 lb 6.4 oz (58.2 kg)  12/07/22 130 lb (59 kg)  05/25/22 128 lb (58.1 kg)   Body mass index is 24.26 kg/m.    Physical Exam Constitutional:      General: She is not in acute distress.    Appearance: Normal appearance.  HENT:     Head: Normocephalic and  atraumatic.  Eyes:     Conjunctiva/sclera: Conjunctivae normal.  Cardiovascular:     Rate and Rhythm: Normal rate and regular rhythm.     Heart sounds: Normal heart sounds.  Pulmonary:     Effort: Pulmonary effort is normal. No respiratory distress.     Breath sounds: Normal breath sounds. No wheezing.  Musculoskeletal:     Cervical back: Neck supple.     Right lower leg: No edema.     Left lower leg: No edema.  Lymphadenopathy:     Cervical: No cervical adenopathy.  Skin:    General: Skin is warm and dry.     Findings: No rash.  Neurological:     Mental Status: She is alert. Mental status is at baseline.  Psychiatric:        Mood and Affect: Mood normal.        Behavior: Behavior normal.        Lab Results  Component Value Date   WBC 6.6 05/25/2022   HGB 12.5 05/25/2022   HCT 36.9 05/25/2022   PLT 296.0 05/25/2022   GLUCOSE 96 05/25/2022   CHOL 197 07/14/2021   TRIG 70.0 07/14/2021    HDL 66.80 07/14/2021   LDLDIRECT 105.3 08/03/2010   LDLCALC 116 (H) 07/14/2021   ALT 22 05/25/2022   AST 26 05/25/2022   NA 139 05/25/2022   K 3.6 05/25/2022   CL 100 05/25/2022   CREATININE 0.60 05/25/2022   BUN 18 05/25/2022   CO2 31 05/25/2022   TSH 3.44 12/07/2022   HGBA1C 5.1 11/19/2014     Assessment & Plan:    See Problem List for Assessment and Plan of chronic medical problems.

## 2023-03-23 ENCOUNTER — Ambulatory Visit (INDEPENDENT_AMBULATORY_CARE_PROVIDER_SITE_OTHER): Payer: BC Managed Care – PPO | Admitting: Internal Medicine

## 2023-03-23 VITALS — BP 108/74 | HR 80 | Temp 98.0°F | Ht 61.0 in | Wt 128.4 lb

## 2023-03-23 DIAGNOSIS — E039 Hypothyroidism, unspecified: Secondary | ICD-10-CM

## 2023-03-23 DIAGNOSIS — F411 Generalized anxiety disorder: Secondary | ICD-10-CM

## 2023-03-23 DIAGNOSIS — R5383 Other fatigue: Secondary | ICD-10-CM | POA: Diagnosis not present

## 2023-03-23 DIAGNOSIS — F3289 Other specified depressive episodes: Secondary | ICD-10-CM | POA: Diagnosis not present

## 2023-03-23 LAB — CBC WITH DIFFERENTIAL/PLATELET
Basophils Absolute: 0.1 10*3/uL (ref 0.0–0.1)
Basophils Relative: 1 % (ref 0.0–3.0)
Eosinophils Absolute: 0.1 10*3/uL (ref 0.0–0.7)
Eosinophils Relative: 2.5 % (ref 0.0–5.0)
HCT: 37.7 % (ref 36.0–46.0)
Hemoglobin: 12.4 g/dL (ref 12.0–15.0)
Lymphocytes Relative: 43.6 % (ref 12.0–46.0)
Lymphs Abs: 2.6 10*3/uL (ref 0.7–4.0)
MCHC: 33 g/dL (ref 30.0–36.0)
MCV: 86.5 fl (ref 78.0–100.0)
Monocytes Absolute: 0.5 10*3/uL (ref 0.1–1.0)
Monocytes Relative: 8.4 % (ref 3.0–12.0)
Neutro Abs: 2.6 10*3/uL (ref 1.4–7.7)
Neutrophils Relative %: 44.5 % (ref 43.0–77.0)
Platelets: 322 10*3/uL (ref 150.0–400.0)
RBC: 4.36 Mil/uL (ref 3.87–5.11)
RDW: 12.9 % (ref 11.5–15.5)
WBC: 6 10*3/uL (ref 4.0–10.5)

## 2023-03-23 LAB — COMPREHENSIVE METABOLIC PANEL
ALT: 18 U/L (ref 0–35)
AST: 24 U/L (ref 0–37)
Albumin: 4.6 g/dL (ref 3.5–5.2)
Alkaline Phosphatase: 73 U/L (ref 39–117)
BUN: 15 mg/dL (ref 6–23)
CO2: 30 mEq/L (ref 19–32)
Calcium: 9.7 mg/dL (ref 8.4–10.5)
Chloride: 102 mEq/L (ref 96–112)
Creatinine, Ser: 0.68 mg/dL (ref 0.40–1.20)
GFR: 93.19 mL/min (ref >60.00)
Glucose, Bld: 102 mg/dL — ABNORMAL HIGH (ref 70–99)
Potassium: 3.8 mEq/L (ref 3.5–5.1)
Sodium: 140 mEq/L (ref 135–145)
Total Bilirubin: 1.6 mg/dL — ABNORMAL HIGH (ref 0.2–1.2)
Total Protein: 7.9 g/dL (ref 6.0–8.3)

## 2023-03-23 LAB — TSH: TSH: 0.92 u[IU]/mL (ref 0.35–5.50)

## 2023-03-23 LAB — T4, FREE: Free T4: 1.16 ng/dL (ref 0.60–1.60)

## 2023-03-23 NOTE — Assessment & Plan Note (Signed)
Acute on chronic Has noticed increased fatigue recently Possibly multifactorial Her sleep is most likely causing some of her fatigue She is going to bed later than she should-discussed the need to change some of her routine so that she can get to bed earlier Can try melatonin Continue regular exercise Will check TFTs, CBC, CMP to rule out other causes of fatigue

## 2023-03-23 NOTE — Assessment & Plan Note (Signed)
Chronic Fairly controlled-she still worries a lot, but feels like overall anxiety is fairly controlled Continue paroxetine 20 mg daily

## 2023-03-23 NOTE — Assessment & Plan Note (Signed)
Chronic Feeling more tired than usual recently Her poor sleep is likely contributing, but need to rule out element of her thyroid disease contributing for her energy level Check TSH, free T4 Continue levothyroxine 75 mg daily and will adjust dose if necessary

## 2023-03-23 NOTE — Assessment & Plan Note (Signed)
Chronic Controlled, Stable Continue Paxil 20 mg daily 

## 2023-03-27 DIAGNOSIS — J3089 Other allergic rhinitis: Secondary | ICD-10-CM | POA: Diagnosis not present

## 2023-03-27 DIAGNOSIS — J3081 Allergic rhinitis due to animal (cat) (dog) hair and dander: Secondary | ICD-10-CM | POA: Diagnosis not present

## 2023-03-27 DIAGNOSIS — J301 Allergic rhinitis due to pollen: Secondary | ICD-10-CM | POA: Diagnosis not present

## 2023-04-04 DIAGNOSIS — J3081 Allergic rhinitis due to animal (cat) (dog) hair and dander: Secondary | ICD-10-CM | POA: Diagnosis not present

## 2023-04-04 DIAGNOSIS — J301 Allergic rhinitis due to pollen: Secondary | ICD-10-CM | POA: Diagnosis not present

## 2023-04-04 DIAGNOSIS — J3089 Other allergic rhinitis: Secondary | ICD-10-CM | POA: Diagnosis not present

## 2023-04-12 ENCOUNTER — Other Ambulatory Visit: Payer: Self-pay | Admitting: Internal Medicine

## 2023-04-12 DIAGNOSIS — J301 Allergic rhinitis due to pollen: Secondary | ICD-10-CM | POA: Diagnosis not present

## 2023-04-12 DIAGNOSIS — J3081 Allergic rhinitis due to animal (cat) (dog) hair and dander: Secondary | ICD-10-CM | POA: Diagnosis not present

## 2023-04-12 DIAGNOSIS — J3089 Other allergic rhinitis: Secondary | ICD-10-CM | POA: Diagnosis not present

## 2023-04-16 NOTE — Progress Notes (Unsigned)
Subjective:    Patient ID: Sharon Stephenson, female    DOB: [birth year], 62 y.o.   MRN: [id number]      HPI Sharon Stephenson is here for No chief complaint on file.    Here to discuss colonoscopy-last colonoscopy 2010-advised repeat in 10 years.  Right groin pain   -when moving she is ok, but when she gets up from sitting and the first few stops.  It hurts when gets down on the ground to garden.  No pain with exercise.    Started one month ago.  Getting a little better.    Medications and allergies reviewed with patient and updated if appropriate.  Current Outpatient Medications on File Prior to Visit  Medication Sig Dispense Refill   ALPHA LIPOIC ACID PO Take by mouth.     Ascorbic Acid (VITAMIN C) 1000 MG tablet Take 1,000 mg by mouth daily.     Biotin 5 MG CAPS Take by mouth.     Calcium-Magnesium-Vitamin D (CALCIUM MAGNESIUM PO) Take 1,000 mg by mouth.     COLLAGEN PO Take by mouth.     EPINEPHrine 0.3 mg/0.3 mL IJ SOAJ injection AS DIRECTED INJECTION AS NEEDED 30 DAYS     Evening Primrose Oil 500 MG CAPS Take 1 capsule by mouth daily.     Fish Oil-Cholecalciferol (OMEGA-3 FISH OIL-VITAMIN D3) 1200-1000 MG-UNIT CAPS Take by mouth daily.     gabapentin (NEURONTIN) 300 MG capsule TAKE 1 CAPSULE BY MOUTH TWICE A DAY 180 capsule 3   levothyroxine (SYNTHROID) 75 MCG tablet TAKE 1 TABLET BY MOUTH EVERY DAY 90 tablet 3   Magnesium 200 MG TABS Take by mouth daily.     Multiple Vitamin (MULTIVITAMINS PO)      NON FORMULARY Zincl supplement ovc as needed     NON FORMULARY Iron supplement take once a day     PARoxetine (PAXIL) 20 MG tablet TAKE 1 TABLET BY MOUTH EVERY DAY 90 tablet 1   Probiotic Product (PROBIOTIC FORMULA) CAPS Take by mouth daily.     Turmeric 500 MG CAPS Take by mouth daily.     No current facility-administered medications on file prior to visit.    Review of Systems     Objective:   Vitals:   04/17/23 1255  BP: 118/70  Pulse: 73  Temp: 98.7 F (37.1 C)  SpO2:  96%   BP Readings from Last 3 Encounters:  04/17/23 118/70  03/23/23 108/74  12/07/22 116/70   Wt Readings from Last 3 Encounters:  04/17/23 128 lb (58.1 kg)  03/23/23 128 lb 6.4 oz (58.2 kg)  12/07/22 130 lb (59 kg)   Body mass index is 24.19 kg/m.    Physical Exam Constitutional:      General: She is not in acute distress.    Appearance: Normal appearance. She is not ill-appearing.  HENT:     Head: Normocephalic and atraumatic.  Musculoskeletal:        General: No swelling, tenderness (no tenderness in right groin or lateral right hip) or deformity. Normal range of motion.     Right lower leg: No edema.     Left lower leg: No edema.  Skin:    General: Skin is warm and dry.  Neurological:     Mental Status: She is alert.     Sensory: No sensory deficit.     Motor: No weakness.     Gait: Gait normal.            Assessment &  Plan:    See Problem List for Assessment and Plan of chronic medical problems.

## 2023-04-17 ENCOUNTER — Encounter: Payer: Self-pay | Admitting: Internal Medicine

## 2023-04-17 ENCOUNTER — Ambulatory Visit (INDEPENDENT_AMBULATORY_CARE_PROVIDER_SITE_OTHER): Payer: BC Managed Care – PPO | Admitting: Internal Medicine

## 2023-04-17 ENCOUNTER — Ambulatory Visit (INDEPENDENT_AMBULATORY_CARE_PROVIDER_SITE_OTHER): Payer: BC Managed Care – PPO

## 2023-04-17 ENCOUNTER — Encounter: Payer: Self-pay | Admitting: Radiology

## 2023-04-17 VITALS — BP 118/70 | HR 73 | Temp 98.7°F | Ht 61.0 in | Wt 128.0 lb

## 2023-04-17 DIAGNOSIS — R1031 Right lower quadrant pain: Secondary | ICD-10-CM

## 2023-04-17 DIAGNOSIS — Z1211 Encounter for screening for malignant neoplasm of colon: Secondary | ICD-10-CM

## 2023-04-17 DIAGNOSIS — M160 Bilateral primary osteoarthritis of hip: Secondary | ICD-10-CM | POA: Diagnosis not present

## 2023-04-17 DIAGNOSIS — M25551 Pain in right hip: Secondary | ICD-10-CM | POA: Diagnosis not present

## 2023-04-17 NOTE — Patient Instructions (Addendum)
    Call GI to schedule a colonoscopy - Phone: [phone]    Have an xray downstairs.    Medications changes include :   none       Return if symptoms worsen or fail to improve.

## 2023-04-17 NOTE — Assessment & Plan Note (Signed)
Acute X 1 month - getting a little better Pain with standing and the first few steps and sometimes getting down to the ground. No pain with walking or exercise Likely tight hip flexors - less likely OA Hip xray today Hip flexor stretches She will let me know if no improvement

## 2023-04-19 DIAGNOSIS — J3089 Other allergic rhinitis: Secondary | ICD-10-CM | POA: Diagnosis not present

## 2023-04-19 DIAGNOSIS — J301 Allergic rhinitis due to pollen: Secondary | ICD-10-CM | POA: Diagnosis not present

## 2023-04-19 DIAGNOSIS — J3081 Allergic rhinitis due to animal (cat) (dog) hair and dander: Secondary | ICD-10-CM | POA: Diagnosis not present

## 2023-04-25 DIAGNOSIS — J3081 Allergic rhinitis due to animal (cat) (dog) hair and dander: Secondary | ICD-10-CM | POA: Diagnosis not present

## 2023-04-25 DIAGNOSIS — J301 Allergic rhinitis due to pollen: Secondary | ICD-10-CM | POA: Diagnosis not present

## 2023-04-25 DIAGNOSIS — J3089 Other allergic rhinitis: Secondary | ICD-10-CM | POA: Diagnosis not present

## 2023-05-02 ENCOUNTER — Ambulatory Visit (AMBULATORY_SURGERY_CENTER): Payer: BC Managed Care – PPO | Admitting: *Deleted

## 2023-05-02 VITALS — Ht 61.0 in | Wt 125.0 lb

## 2023-05-02 DIAGNOSIS — Z1211 Encounter for screening for malignant neoplasm of colon: Secondary | ICD-10-CM

## 2023-05-02 MED ORDER — NA SULFATE-K SULFATE-MG SULF 17.5-3.13-1.6 GM/177ML PO SOLN
1.0000 | Freq: Once | ORAL | 0 refills | Status: AC
Start: 2023-05-02 — End: 2023-05-02

## 2023-05-02 NOTE — Progress Notes (Signed)

## 2023-05-16 DIAGNOSIS — J3081 Allergic rhinitis due to animal (cat) (dog) hair and dander: Secondary | ICD-10-CM | POA: Diagnosis not present

## 2023-05-16 DIAGNOSIS — J301 Allergic rhinitis due to pollen: Secondary | ICD-10-CM | POA: Diagnosis not present

## 2023-05-16 DIAGNOSIS — J3089 Other allergic rhinitis: Secondary | ICD-10-CM | POA: Diagnosis not present

## 2023-05-19 ENCOUNTER — Other Ambulatory Visit: Payer: Self-pay | Admitting: Internal Medicine

## 2023-05-23 ENCOUNTER — Encounter (INDEPENDENT_AMBULATORY_CARE_PROVIDER_SITE_OTHER): Payer: Self-pay

## 2023-05-25 DIAGNOSIS — J301 Allergic rhinitis due to pollen: Secondary | ICD-10-CM | POA: Diagnosis not present

## 2023-05-25 DIAGNOSIS — J3081 Allergic rhinitis due to animal (cat) (dog) hair and dander: Secondary | ICD-10-CM | POA: Diagnosis not present

## 2023-05-25 DIAGNOSIS — J3089 Other allergic rhinitis: Secondary | ICD-10-CM | POA: Diagnosis not present

## 2023-05-28 DIAGNOSIS — S39012A Strain of muscle, fascia and tendon of lower back, initial encounter: Secondary | ICD-10-CM | POA: Diagnosis not present

## 2023-05-30 DIAGNOSIS — J3081 Allergic rhinitis due to animal (cat) (dog) hair and dander: Secondary | ICD-10-CM | POA: Diagnosis not present

## 2023-05-30 DIAGNOSIS — J301 Allergic rhinitis due to pollen: Secondary | ICD-10-CM | POA: Diagnosis not present

## 2023-05-30 DIAGNOSIS — J3089 Other allergic rhinitis: Secondary | ICD-10-CM | POA: Diagnosis not present

## 2023-06-05 ENCOUNTER — Encounter: Payer: Self-pay | Admitting: Gastroenterology

## 2023-06-07 ENCOUNTER — Ambulatory Visit: Payer: BC Managed Care – PPO | Admitting: Internal Medicine

## 2023-06-08 DIAGNOSIS — J3089 Other allergic rhinitis: Secondary | ICD-10-CM | POA: Diagnosis not present

## 2023-06-08 DIAGNOSIS — J301 Allergic rhinitis due to pollen: Secondary | ICD-10-CM | POA: Diagnosis not present

## 2023-06-08 DIAGNOSIS — J3081 Allergic rhinitis due to animal (cat) (dog) hair and dander: Secondary | ICD-10-CM | POA: Diagnosis not present

## 2023-06-20 ENCOUNTER — Ambulatory Visit: Payer: BC Managed Care – PPO | Admitting: Gastroenterology

## 2023-06-20 ENCOUNTER — Encounter: Payer: Self-pay | Admitting: Gastroenterology

## 2023-06-20 VITALS — BP 104/60 | HR 74 | Temp 98.5°F | Resp 14 | Ht 61.0 in | Wt 125.0 lb

## 2023-06-20 DIAGNOSIS — D122 Benign neoplasm of ascending colon: Secondary | ICD-10-CM | POA: Diagnosis not present

## 2023-06-20 DIAGNOSIS — K635 Polyp of colon: Secondary | ICD-10-CM | POA: Diagnosis not present

## 2023-06-20 DIAGNOSIS — Z1211 Encounter for screening for malignant neoplasm of colon: Secondary | ICD-10-CM | POA: Diagnosis not present

## 2023-06-20 MED ORDER — SODIUM CHLORIDE 0.9 % IV SOLN
500.0000 mL | Freq: Once | INTRAVENOUS | Status: DC
Start: 2023-06-20 — End: 2023-06-20

## 2023-06-20 NOTE — Op Note (Signed)
[facility] Patient Name: Sharon Stephenson Procedure Date: 06/20/2023 2:55 PM MRN: [id number] Endoscopist: Napoleon Form , MD, [id number] Age: 62 Referring MD:  Date of Birth: [birth year] Gender: Female Account #: [id number] Procedure:                Colonoscopy Indications:              Screening for colorectal malignant neoplasm Medicines:                Monitored Anesthesia Care Procedure:                Pre-Anesthesia Assessment:                           - Prior to the procedure, a History and Physical                            was performed, and patient medications and                            allergies were reviewed. The patient's tolerance of                            previous anesthesia was also reviewed. The risks                            and benefits of the procedure and the sedation                            options and risks were discussed with the patient.                            All questions were answered, and informed consent                            was obtained. Prior Anticoagulants: The patient has                            taken no anticoagulant or antiplatelet agents. ASA                            Grade Assessment: II - A patient with mild systemic                            disease. After reviewing the risks and benefits,                            the patient was deemed in satisfactory condition to                            undergo the procedure.                           After obtaining informed consent, the colonoscope  was passed under direct vision. Throughout the                            procedure, the patient's blood pressure, pulse, and                            oxygen saturations were monitored continuously. The                            [id number]-HQ190L Colonoscope [id number] was introduced                            through the anus and advanced to the the cecum,                            identified by  appendiceal orifice and ileocecal                            valve. The colonoscopy was performed without                            difficulty. The patient tolerated the procedure                            well. The quality of the bowel preparation was                            good. The ileocecal valve, appendiceal orifice, and                            rectum were photographed. Scope In: 3:18:10 PM Scope Out: 3:29:29 PM Scope Withdrawal Time: 0 hours 6 minutes 31 seconds  Total Procedure Duration: 0 hours 11 minutes 19 seconds  Findings:                 The perianal and digital rectal examinations were                            normal.                           Two sessile polyps were found in the ascending                            colon. The polyps were 3 to 6 mm in size. These                            polyps were removed with a cold snare. Resection                            and retrieval were complete.                           A few small-mouthed diverticula were found in the  sigmoid colon.                           Non-bleeding internal hemorrhoids were found during                            retroflexion. The hemorrhoids were small. Complications:            No immediate complications. Estimated Blood Loss:     Estimated blood loss was minimal. Impression:               - Two 3 to 6 mm polyps in the ascending colon,                            removed with a cold snare. Resected and retrieved.                           - Diverticulosis in the sigmoid colon.                           - Non-bleeding internal hemorrhoids. Recommendation:           - Patient has a contact number available for                            emergencies. The signs and symptoms of potential                            delayed complications were discussed with the                            patient. Return to normal activities tomorrow.                            Written  discharge instructions were provided to the                            patient.                           - Resume previous diet.                           - Continue present medications.                           - Repeat colonoscopy in 5-10 years for surveillance                            based on pathology results. Napoleon Form, MD 06/20/2023 3:35:03 PM This report has been signed electronically.

## 2023-06-20 NOTE — Patient Instructions (Signed)
   Handouts on polyps,diverticulosis,& hemorrhoids given to you today.   Await pathology results on polyps removed    Resume usual diet & medications     YOU HAD AN ENDOSCOPIC PROCEDURE TODAY AT THE [facility]:   Refer to the procedure report that was given to you for any specific questions about what was found during the examination.  If the procedure report does not answer your questions, please call your gastroenterologist to clarify.  If you requested that your care partner not be given the details of your procedure findings, then the procedure report has been included in a sealed envelope for you to review at your convenience later.  YOU SHOULD EXPECT: Some feelings of bloating in the abdomen. Passage of more gas than usual.  Walking can help get rid of the air that was put into your GI tract during the procedure and reduce the bloating. If you had a lower endoscopy (such as a colonoscopy or flexible sigmoidoscopy) you may notice spotting of blood in your stool or on the toilet paper. If you underwent a bowel prep for your procedure, you may not have a normal bowel movement for a few days.  Please Note:  You might notice some irritation and congestion in your nose or some drainage.  This is from the oxygen used during your procedure.  There is no need for concern and it should clear up in a day or so.  SYMPTOMS TO REPORT IMMEDIATELY:  Following lower endoscopy (colonoscopy or flexible sigmoidoscopy):  Excessive amounts of blood in the stool  Significant tenderness or worsening of abdominal pains  Swelling of the abdomen that is new, acute  Fever of 100F or higher   For urgent or emergent issues, a gastroenterologist can be reached at any hour by calling [phone]. Do not use MyChart messaging for urgent concerns.    DIET:  We do recommend a small meal at first, but then you may proceed to your regular diet.  Drink plenty of fluids but you should avoid alcoholic  beverages for 24 hours.  ACTIVITY:  You should plan to take it easy for the rest of today and you should NOT DRIVE or use heavy machinery until tomorrow (because of the sedation medicines used during the test).    FOLLOW UP: Our staff will call the number listed on your records the next business day following your procedure.  We will call around 7:15- 8:00 am to check on you and address any questions or concerns that you may have regarding the information given to you following your procedure. If we do not reach you, we will leave a message.     If any biopsies were taken you will be contacted by phone or by letter within the next 1-3 weeks.  Please call us at [phone] if you have not heard about the biopsies in 3 weeks.    SIGNATURES/CONFIDENTIALITY: You and/or your care partner have signed paperwork which will be entered into your electronic medical record.  These signatures attest to the fact that that the information above on your After Visit Summary has been reviewed and is understood.  Full responsibility of the confidentiality of this discharge information lies with you and/or your care-partner.

## 2023-06-20 NOTE — Progress Notes (Signed)
[facility] History and Physical   Primary Care Physician:  Pincus Sanes, MD   Reason for Procedure:  Colorectal cancer screening  Plan:    Screening colonoscopy with possible interventions as needed     HPI: Sharon Stephenson is a very pleasant 62 y.o. female here for screening colonoscopy. Denies any nausea, vomiting, abdominal pain, melena or bright red blood per rectum  The risks and benefits as well as alternatives of endoscopic procedure(s) have been discussed and reviewed. All questions answered. The patient agrees to proceed.    Past Medical History:  Diagnosis Date   ANEMIA, OTHER UNSPEC    ANXIETY    Arthritis    joints   Cervicalgia    chronic - follows with neuro in [place]   DEPRESSION    hx of   Dermatitis    HYPOTHYROIDISM    Lumbar radicular syndrome spring 2013   Osteopenia 01/05/2014   DEXA @ [facility] 12/2013: -1.2   Osteopenia    Small fiber polyneuropathy 11/2014   Dr. Asa Lente    Past Surgical History:  Procedure Laterality Date   BREAST SURGERY     Breast reduction   COLONOSCOPY     TONSILLECTOMY      Prior to Admission medications   Medication Sig Start Date End Date Taking? Authorizing Provider  gabapentin (NEURONTIN) 300 MG capsule TAKE 1 CAPSULE BY MOUTH TWICE A DAY 03/23/22  Yes Burns, Bobette Mo, MD  levothyroxine (SYNTHROID) 75 MCG tablet TAKE 1 TABLET BY MOUTH EVERY DAY 05/21/23  Yes Burns, Bobette Mo, MD  PARoxetine (PAXIL) 20 MG tablet TAKE 1 TABLET BY MOUTH EVERY DAY 04/12/23  Yes Burns, Bobette Mo, MD  ALPHA LIPOIC ACID PO Take by mouth.    [provider]  Ascorbic Acid (VITAMIN C) 1000 MG tablet Take 1,000 mg by mouth daily.    [provider]  Biotin 5 MG CAPS Take by mouth.    [provider]  Calcium-Magnesium-Vitamin D (CALCIUM MAGNESIUM PO) Take 1,000 mg by mouth.    [provider]  COLLAGEN PO Take by mouth. Patient not taking: Reported on 05/02/2023    [provider]  EPINEPHrine  0.3 mg/0.3 mL IJ SOAJ injection AS DIRECTED INJECTION AS NEEDED 30 DAYS Patient not taking: Reported on 05/02/2023    [provider]  Evening Primrose Oil 500 MG CAPS Take 1 capsule by mouth daily. Patient not taking: Reported on 06/20/2023    [provider]  Fish Oil-Cholecalciferol (OMEGA-3 FISH OIL-VITAMIN D3) 1200-1000 MG-UNIT CAPS Take by mouth daily.    [provider]  Magnesium 200 MG TABS Take by mouth daily.    [provider]  Multiple Vitamin (MULTIVITAMINS PO)     [provider]  NON FORMULARY Zincl supplement ovc as needed    [provider]  NON FORMULARY Iron supplement take once a day    [provider]  Probiotic Product (PROBIOTIC FORMULA) CAPS Take by mouth daily.    [provider]  Turmeric 500 MG CAPS Take by mouth daily.    [provider]    Current Outpatient Medications  Medication Sig Dispense Refill   gabapentin (NEURONTIN) 300 MG capsule TAKE 1 CAPSULE BY MOUTH TWICE A DAY 180 capsule 3   levothyroxine (SYNTHROID) 75 MCG tablet TAKE 1 TABLET BY MOUTH EVERY DAY 90 tablet 3   PARoxetine (PAXIL) 20 MG tablet TAKE 1 TABLET BY MOUTH EVERY DAY 90 tablet 1   ALPHA LIPOIC ACID  PO Take by mouth.     Ascorbic Acid (VITAMIN C) 1000 MG tablet Take 1,000 mg by mouth daily.     Biotin 5 MG CAPS Take by mouth.     Calcium-Magnesium-Vitamin D (CALCIUM MAGNESIUM PO) Take 1,000 mg by mouth.     COLLAGEN PO Take by mouth. (Patient not taking: Reported on 05/02/2023)     EPINEPHrine 0.3 mg/0.3 mL IJ SOAJ injection AS DIRECTED INJECTION AS NEEDED 30 DAYS (Patient not taking: Reported on 05/02/2023)     Evening Primrose Oil 500 MG CAPS Take 1 capsule by mouth daily. (Patient not taking: Reported on 06/20/2023)     Fish Oil-Cholecalciferol (OMEGA-3 FISH OIL-VITAMIN D3) 1200-1000 MG-UNIT CAPS Take by mouth daily.     Magnesium 200 MG TABS Take by mouth daily.     Multiple Vitamin (MULTIVITAMINS PO)       NON FORMULARY Zincl supplement ovc as needed     NON FORMULARY Iron supplement take once a day     Probiotic Product (PROBIOTIC FORMULA) CAPS Take by mouth daily.     Turmeric 500 MG CAPS Take by mouth daily.     Current Facility-Administered Medications  Medication Dose Route Frequency Provider Last Rate Last Admin   0.9 %  sodium chloride infusion  500 mL Intravenous Once Napoleon Form, MD        Allergies as of 06/20/2023 - Review Complete 06/20/2023  Allergen Reaction Noted   Levonorgestrel-ethinyl estrad  05/25/2022   Nitrofurantoin  06/17/2007   Nitrofurantoin macrocrystal Other (See Comments) 05/22/2022   Penicillin g sodium  06/20/2023   Penicillins Other (See Comments) 10/23/1964    Family History  Problem Relation Age of Onset   Kidney disease Father    Colon cancer Paternal Grandmother    Esophageal cancer Neg Hx    Rectal cancer Neg Hx    Stomach cancer Neg Hx     Social History   Socioeconomic History   Marital status: Married    Spouse name: Not on file   Number of children: Not on file   Years of education: Not on file   Highest education level: Bachelor's degree (e.g., BA, AB, BS)  Occupational History   Not on file  Tobacco Use   Smoking status: Former    Current packs/day: 0.00    Types: Cigarettes    Quit date: 10/23/1985    Years since quitting: 37.6   Smokeless tobacco: Never  Vaping Use   Vaping status: Never Used  Substance and Sexual Activity   Alcohol use: Yes    Comment: occ   Drug use: No   Sexual activity: Not on file  Other Topics Concern   Not on file  Social History Narrative   Married, lives with spouse; no children. Originally from [place]: Low Risk  (04/13/2023)   Overall Financial Resource Strain (CARDIA)    Difficulty of Paying Living Expenses: Not hard at all  Food Insecurity: No Food Insecurity (04/13/2023)   Hunger Vital Sign    Worried About Running Out  of Food in the Last Year: Never true    Ran Out of Food in the Last Year: Never true  Transportation Needs: No Transportation Needs (04/13/2023)   PRAPARE - Administrator, Civil Service (Medical): No    Lack of Transportation (Non-Medical): No  Physical Activity: Insufficiently Active (04/13/2023)   Exercise Vital Sign    Days of Exercise per  Week: 4 days    Minutes of Exercise per Session: 30 min  Stress: Stress Concern Present (04/13/2023)   [employer] of Occupational Health - Occupational Stress Questionnaire    Feeling of Stress : To some extent  Social Connections: Unknown (04/13/2023)   Social Connection and Isolation Panel [NHANES]    Frequency of Communication with Friends and Family: More than three times a week    Frequency of Social Gatherings with Friends and Family: Patient declined    Attends Religious Services: Patient declined    Database administrator or Organizations: No    Attends Engineer, structural: Not on file    Marital Status: Married  Catering manager Violence: Not on file    Review of Systems:  All other review of systems negative except as mentioned in the HPI.  Physical Exam: Vital signs in last 24 hours: BP (!) 108/58   Pulse 84   Temp 98.5 F (36.9 C)   Ht 5\' 1"  (1.549 m)   Wt 125 lb (56.7 kg)   SpO2 98%   BMI 23.62 kg/m  General:   Alert, NAD Lungs:  Clear .   Heart:  Regular rate and rhythm Abdomen:  Soft, nontender and nondistended. Neuro/Psych:  Alert and cooperative. Normal mood and affect. A and O x 3  Reviewed labs, radiology imaging, old records and pertinent past GI work up  Patient is appropriate for planned procedure(s) and anesthesia in an ambulatory setting   K. Scherry Ran , MD [phone]

## 2023-06-20 NOTE — Progress Notes (Signed)
Called to room to assist during endoscopic procedure.  Patient ID and intended procedure confirmed with present staff. Received instructions for my participation in the procedure from the performing physician.  

## 2023-06-20 NOTE — Progress Notes (Signed)
Vss nad trans to pacu 

## 2023-06-20 NOTE — Progress Notes (Signed)
Pt's states no medical or surgical changes since previsit or office visit. 

## 2023-06-21 ENCOUNTER — Telehealth: Payer: Self-pay | Admitting: *Deleted

## 2023-06-21 NOTE — Telephone Encounter (Signed)
  Follow up Call-     06/20/2023    2:39 PM  Call back number  Post procedure Call Back phone  # [phone]  Permission to leave phone message Yes     Patient questions:  Do you have a fever, pain , or abdominal swelling? No. Pain Score  0 *  Have you tolerated food without any problems? Yes.    Have you been able to return to your normal activities? Yes.    Do you have any questions about your discharge instructions: Diet   No. Medications  No. Follow up visit  No.  Do you have questions or concerns about your Care? No.  Actions: * If pain score is 4 or above: No action needed, pain <4.

## 2023-06-22 DIAGNOSIS — J3089 Other allergic rhinitis: Secondary | ICD-10-CM | POA: Diagnosis not present

## 2023-06-22 DIAGNOSIS — J301 Allergic rhinitis due to pollen: Secondary | ICD-10-CM | POA: Diagnosis not present

## 2023-06-22 DIAGNOSIS — J3081 Allergic rhinitis due to animal (cat) (dog) hair and dander: Secondary | ICD-10-CM | POA: Diagnosis not present

## 2023-06-26 DIAGNOSIS — M47816 Spondylosis without myelopathy or radiculopathy, lumbar region: Secondary | ICD-10-CM | POA: Diagnosis not present

## 2023-06-28 DIAGNOSIS — J301 Allergic rhinitis due to pollen: Secondary | ICD-10-CM | POA: Diagnosis not present

## 2023-06-28 DIAGNOSIS — J3081 Allergic rhinitis due to animal (cat) (dog) hair and dander: Secondary | ICD-10-CM | POA: Diagnosis not present

## 2023-06-28 DIAGNOSIS — J3089 Other allergic rhinitis: Secondary | ICD-10-CM | POA: Diagnosis not present

## 2023-07-06 DIAGNOSIS — J301 Allergic rhinitis due to pollen: Secondary | ICD-10-CM | POA: Diagnosis not present

## 2023-07-06 DIAGNOSIS — J3089 Other allergic rhinitis: Secondary | ICD-10-CM | POA: Diagnosis not present

## 2023-07-06 DIAGNOSIS — J3081 Allergic rhinitis due to animal (cat) (dog) hair and dander: Secondary | ICD-10-CM | POA: Diagnosis not present

## 2023-07-06 DIAGNOSIS — M545 Low back pain, unspecified: Secondary | ICD-10-CM | POA: Diagnosis not present

## 2023-07-16 ENCOUNTER — Ambulatory Visit
Admission: RE | Admit: 2023-07-16 | Discharge: 2023-07-16 | Disposition: A | Discharge status: Home / Self Care | Payer: BC Managed Care – PPO | Source: Ambulatory Visit | Attending: Obstetrics and Gynecology | Admitting: Obstetrics and Gynecology

## 2023-07-16 DIAGNOSIS — M8580 Other specified disorders of bone density and structure, unspecified site: Secondary | ICD-10-CM

## 2023-07-16 DIAGNOSIS — M8588 Other specified disorders of bone density and structure, other site: Secondary | ICD-10-CM | POA: Diagnosis not present

## 2023-07-16 DIAGNOSIS — N958 Other specified menopausal and perimenopausal disorders: Secondary | ICD-10-CM | POA: Diagnosis not present

## 2023-07-16 DIAGNOSIS — E349 Endocrine disorder, unspecified: Secondary | ICD-10-CM | POA: Diagnosis not present

## 2023-07-17 ENCOUNTER — Encounter: Payer: Self-pay | Admitting: Gastroenterology

## 2023-08-07 DIAGNOSIS — Z01419 Encounter for gynecological examination (general) (routine) without abnormal findings: Secondary | ICD-10-CM | POA: Diagnosis not present

## 2023-08-07 DIAGNOSIS — Z1231 Encounter for screening mammogram for malignant neoplasm of breast: Secondary | ICD-10-CM | POA: Diagnosis not present

## 2023-08-13 DIAGNOSIS — M13842 Other specified arthritis, left hand: Secondary | ICD-10-CM | POA: Diagnosis not present

## 2023-08-13 DIAGNOSIS — M65342 Trigger finger, left ring finger: Secondary | ICD-10-CM | POA: Diagnosis not present

## 2023-08-15 ENCOUNTER — Other Ambulatory Visit: Payer: Self-pay | Admitting: Internal Medicine

## 2023-09-25 ENCOUNTER — Encounter: Payer: Self-pay | Admitting: Internal Medicine

## 2023-09-25 NOTE — Progress Notes (Unsigned)
    Subjective:    Patient ID: Sharon Stephenson, female    DOB: 10/11/1961, 62 y.o.   MRN: [id number]      HPI Sharon Stephenson is here for No chief complaint on file.   Not feeling well - mentally and physically     Medications and allergies reviewed with patient and updated if appropriate.  Current Outpatient Medications on File Prior to Visit  Medication Sig Dispense Refill   ALPHA LIPOIC ACID PO Take by mouth.     Ascorbic Acid (VITAMIN C) 1000 MG tablet Take 1,000 mg by mouth daily.     Biotin 5 MG CAPS Take by mouth.     Calcium-Magnesium-Vitamin D (CALCIUM MAGNESIUM PO) Take 1,000 mg by mouth.     COLLAGEN PO Take by mouth. (Patient not taking: Reported on 05/02/2023)     EPINEPHrine 0.3 mg/0.3 mL IJ SOAJ injection AS DIRECTED INJECTION AS NEEDED 30 DAYS (Patient not taking: Reported on 05/02/2023)     Evening Primrose Oil 500 MG CAPS Take 1 capsule by mouth daily. (Patient not taking: Reported on 06/20/2023)     Fish Oil-Cholecalciferol (OMEGA-3 FISH OIL-VITAMIN D3) 1200-1000 MG-UNIT CAPS Take by mouth daily.     gabapentin (NEURONTIN) 300 MG capsule TAKE 1 CAPSULE BY MOUTH TWICE A DAY 180 capsule 3   levothyroxine (SYNTHROID) 75 MCG tablet TAKE 1 TABLET BY MOUTH EVERY DAY 90 tablet 3   Magnesium 200 MG TABS Take by mouth daily.     Multiple Vitamin (MULTIVITAMINS PO)      NON FORMULARY Zincl supplement ovc as needed     NON FORMULARY Iron supplement take once a day     PARoxetine (PAXIL) 20 MG tablet TAKE 1 TABLET BY MOUTH EVERY DAY 90 tablet 1   Probiotic Product (PROBIOTIC FORMULA) CAPS Take by mouth daily.     Turmeric 500 MG CAPS Take by mouth daily.     No current facility-administered medications on file prior to visit.    Review of Systems     Objective:  There were no vitals filed for this visit. BP Readings from Last 3 Encounters:  06/20/23 104/60  04/17/23 118/70  03/23/23 108/74   Wt Readings from Last 3 Encounters:  06/20/23 125 lb (56.7 kg)  05/02/23 125 lb  (56.7 kg)  04/17/23 128 lb (58.1 kg)   There is no height or weight on file to calculate BMI.    Physical Exam         Assessment & Plan:    See Problem List for Assessment and Plan of chronic medical problems.

## 2023-09-26 ENCOUNTER — Ambulatory Visit: Payer: BC Managed Care – PPO | Admitting: Internal Medicine

## 2023-09-26 VITALS — BP 124/70 | HR 85 | Temp 98.3°F | Ht 61.0 in | Wt 122.0 lb

## 2023-09-26 DIAGNOSIS — F3289 Other specified depressive episodes: Secondary | ICD-10-CM | POA: Diagnosis not present

## 2023-09-26 DIAGNOSIS — E039 Hypothyroidism, unspecified: Secondary | ICD-10-CM

## 2023-09-26 DIAGNOSIS — F411 Generalized anxiety disorder: Secondary | ICD-10-CM | POA: Diagnosis not present

## 2023-09-26 NOTE — Assessment & Plan Note (Signed)
Chronic I think her depression overall is controlled-she may have a little depression because she did have to put her dog down and her mother is sick, but I think she is doing well with all of that Continue paroxetine 20 mg daily She will work on several of the things we discussed that may help with some anxiety and depression related to everything that is going on right now. If there is no improvement she will return for further evaluation

## 2023-09-26 NOTE — Assessment & Plan Note (Signed)
Chronic Clinically euthyroid After we talked through this I do think most of her symptoms are related to some increased anxiety, stress and may be depression and she agrees Will hold off on checking TSH now Continue current dose of levothyroxine 75 mcg daily If symptoms do not improve she will return

## 2023-09-26 NOTE — Patient Instructions (Addendum)
       Try meditation, try deep breathing, try journaling, think about volunteering,  exercise as much as possible.  Work on [employer].  Keep taking the vitamins.      Return if symptoms worsen or fail to improve.

## 2023-09-26 NOTE — Assessment & Plan Note (Addendum)
Chronic Some increased anxiety related to her mother being sick, having to put her dog down and from feeling guilt not being with her mother All of this is resulting in lower energy at times para loss and overall not feeling well Continue paroxetine 20 mg daily-she would like to avoid increasing the dose and I think that is possible Continue regular exercise-work on increasing exercise.  She is always wanted to do yoga and will look into starting yoga She will work on improving her diet Discussed that she can journal, do meditation or deep breathing, volunteering She knows if she keeps busy it would help Continue vitamins-hair loss likely result of extra stress If her symptoms are not improving she will let me know

## 2023-10-25 DIAGNOSIS — M47816 Spondylosis without myelopathy or radiculopathy, lumbar region: Secondary | ICD-10-CM | POA: Diagnosis not present

## 2023-11-22 DIAGNOSIS — M5416 Radiculopathy, lumbar region: Secondary | ICD-10-CM | POA: Diagnosis not present

## 2023-11-22 DIAGNOSIS — Z6822 Body mass index (BMI) 22.0-22.9, adult: Secondary | ICD-10-CM | POA: Diagnosis not present

## 2023-11-22 DIAGNOSIS — M47816 Spondylosis without myelopathy or radiculopathy, lumbar region: Secondary | ICD-10-CM | POA: Diagnosis not present

## 2023-12-17 DIAGNOSIS — M5416 Radiculopathy, lumbar region: Secondary | ICD-10-CM | POA: Diagnosis not present

## 2023-12-28 DIAGNOSIS — G5711 Meralgia paresthetica, right lower limb: Secondary | ICD-10-CM | POA: Diagnosis not present

## 2024-01-02 DIAGNOSIS — M5416 Radiculopathy, lumbar region: Secondary | ICD-10-CM | POA: Diagnosis not present

## 2024-01-14 DIAGNOSIS — M5416 Radiculopathy, lumbar region: Secondary | ICD-10-CM | POA: Diagnosis not present

## 2024-01-30 DIAGNOSIS — M6281 Muscle weakness (generalized): Secondary | ICD-10-CM | POA: Diagnosis not present

## 2024-01-30 DIAGNOSIS — M25551 Pain in right hip: Secondary | ICD-10-CM | POA: Diagnosis not present

## 2024-01-30 DIAGNOSIS — M5459 Other low back pain: Secondary | ICD-10-CM | POA: Diagnosis not present

## 2024-01-30 DIAGNOSIS — R2689 Other abnormalities of gait and mobility: Secondary | ICD-10-CM | POA: Diagnosis not present

## 2024-02-04 DIAGNOSIS — M6281 Muscle weakness (generalized): Secondary | ICD-10-CM | POA: Diagnosis not present

## 2024-02-04 DIAGNOSIS — R2689 Other abnormalities of gait and mobility: Secondary | ICD-10-CM | POA: Diagnosis not present

## 2024-02-04 DIAGNOSIS — M5459 Other low back pain: Secondary | ICD-10-CM | POA: Diagnosis not present

## 2024-02-04 DIAGNOSIS — M25551 Pain in right hip: Secondary | ICD-10-CM | POA: Diagnosis not present

## 2024-02-06 DIAGNOSIS — M5459 Other low back pain: Secondary | ICD-10-CM | POA: Diagnosis not present

## 2024-02-06 DIAGNOSIS — M25551 Pain in right hip: Secondary | ICD-10-CM | POA: Diagnosis not present

## 2024-02-06 DIAGNOSIS — R2689 Other abnormalities of gait and mobility: Secondary | ICD-10-CM | POA: Diagnosis not present

## 2024-02-06 DIAGNOSIS — M6281 Muscle weakness (generalized): Secondary | ICD-10-CM | POA: Diagnosis not present

## 2024-02-18 DIAGNOSIS — R2689 Other abnormalities of gait and mobility: Secondary | ICD-10-CM | POA: Diagnosis not present

## 2024-02-18 DIAGNOSIS — M5459 Other low back pain: Secondary | ICD-10-CM | POA: Diagnosis not present

## 2024-02-18 DIAGNOSIS — M6281 Muscle weakness (generalized): Secondary | ICD-10-CM | POA: Diagnosis not present

## 2024-02-18 DIAGNOSIS — M25551 Pain in right hip: Secondary | ICD-10-CM | POA: Diagnosis not present

## 2024-02-20 DIAGNOSIS — R2689 Other abnormalities of gait and mobility: Secondary | ICD-10-CM | POA: Diagnosis not present

## 2024-02-20 DIAGNOSIS — M6281 Muscle weakness (generalized): Secondary | ICD-10-CM | POA: Diagnosis not present

## 2024-02-20 DIAGNOSIS — M5459 Other low back pain: Secondary | ICD-10-CM | POA: Diagnosis not present

## 2024-02-20 DIAGNOSIS — M25551 Pain in right hip: Secondary | ICD-10-CM | POA: Diagnosis not present

## 2024-02-25 DIAGNOSIS — M5459 Other low back pain: Secondary | ICD-10-CM | POA: Diagnosis not present

## 2024-02-25 DIAGNOSIS — M25551 Pain in right hip: Secondary | ICD-10-CM | POA: Diagnosis not present

## 2024-02-25 DIAGNOSIS — M6281 Muscle weakness (generalized): Secondary | ICD-10-CM | POA: Diagnosis not present

## 2024-02-25 DIAGNOSIS — R2689 Other abnormalities of gait and mobility: Secondary | ICD-10-CM | POA: Diagnosis not present

## 2024-02-27 DIAGNOSIS — M6281 Muscle weakness (generalized): Secondary | ICD-10-CM | POA: Diagnosis not present

## 2024-02-27 DIAGNOSIS — M25551 Pain in right hip: Secondary | ICD-10-CM | POA: Diagnosis not present

## 2024-02-27 DIAGNOSIS — R2689 Other abnormalities of gait and mobility: Secondary | ICD-10-CM | POA: Diagnosis not present

## 2024-02-27 DIAGNOSIS — M5459 Other low back pain: Secondary | ICD-10-CM | POA: Diagnosis not present

## 2024-02-28 DIAGNOSIS — M5459 Other low back pain: Secondary | ICD-10-CM | POA: Diagnosis not present

## 2024-02-28 DIAGNOSIS — R2689 Other abnormalities of gait and mobility: Secondary | ICD-10-CM | POA: Diagnosis not present

## 2024-02-28 DIAGNOSIS — M25551 Pain in right hip: Secondary | ICD-10-CM | POA: Diagnosis not present

## 2024-02-28 DIAGNOSIS — M6281 Muscle weakness (generalized): Secondary | ICD-10-CM | POA: Diagnosis not present

## 2024-03-05 DIAGNOSIS — M6281 Muscle weakness (generalized): Secondary | ICD-10-CM | POA: Diagnosis not present

## 2024-03-05 DIAGNOSIS — M5459 Other low back pain: Secondary | ICD-10-CM | POA: Diagnosis not present

## 2024-03-05 DIAGNOSIS — M25551 Pain in right hip: Secondary | ICD-10-CM | POA: Diagnosis not present

## 2024-03-05 DIAGNOSIS — R2689 Other abnormalities of gait and mobility: Secondary | ICD-10-CM | POA: Diagnosis not present

## 2024-03-14 DIAGNOSIS — M2021 Hallux rigidus, right foot: Secondary | ICD-10-CM | POA: Diagnosis not present

## 2024-03-24 DIAGNOSIS — M65342 Trigger finger, left ring finger: Secondary | ICD-10-CM | POA: Diagnosis not present

## 2024-03-24 DIAGNOSIS — M79602 Pain in left arm: Secondary | ICD-10-CM | POA: Diagnosis not present

## 2024-03-24 DIAGNOSIS — M7542 Impingement syndrome of left shoulder: Secondary | ICD-10-CM | POA: Diagnosis not present

## 2024-04-10 ENCOUNTER — Other Ambulatory Visit: Payer: Self-pay | Admitting: Internal Medicine

## 2024-04-15 DIAGNOSIS — M2021 Hallux rigidus, right foot: Secondary | ICD-10-CM | POA: Diagnosis not present

## 2024-05-03 ENCOUNTER — Other Ambulatory Visit: Payer: Self-pay | Admitting: Internal Medicine

## 2024-05-16 DIAGNOSIS — M2021 Hallux rigidus, right foot: Secondary | ICD-10-CM | POA: Diagnosis not present

## 2024-05-20 ENCOUNTER — Ambulatory Visit: Admitting: Internal Medicine

## 2024-05-21 ENCOUNTER — Encounter: Admitting: Internal Medicine

## 2024-06-05 ENCOUNTER — Encounter: Payer: Self-pay | Admitting: Internal Medicine

## 2024-06-05 DIAGNOSIS — E559 Vitamin D deficiency, unspecified: Secondary | ICD-10-CM | POA: Insufficient documentation

## 2024-06-05 DIAGNOSIS — E039 Hypothyroidism, unspecified: Secondary | ICD-10-CM

## 2024-06-09 ENCOUNTER — Other Ambulatory Visit (INDEPENDENT_AMBULATORY_CARE_PROVIDER_SITE_OTHER)

## 2024-06-09 ENCOUNTER — Encounter: Payer: Self-pay | Admitting: Internal Medicine

## 2024-06-09 DIAGNOSIS — E039 Hypothyroidism, unspecified: Secondary | ICD-10-CM

## 2024-06-09 DIAGNOSIS — E559 Vitamin D deficiency, unspecified: Secondary | ICD-10-CM

## 2024-06-09 LAB — CBC WITH DIFFERENTIAL/PLATELET
Basophils Absolute: 0.1 K/uL (ref 0.0–0.1)
Basophils Relative: 0.9 % (ref 0.0–3.0)
Eosinophils Absolute: 0.1 K/uL (ref 0.0–0.7)
Eosinophils Relative: 2.3 % (ref 0.0–5.0)
HCT: 35.4 % — ABNORMAL LOW (ref 36.0–46.0)
Hemoglobin: 12 g/dL (ref 12.0–15.0)
Lymphocytes Relative: 43.2 % (ref 12.0–46.0)
Lymphs Abs: 2.5 K/uL (ref 0.7–4.0)
MCHC: 34.1 g/dL (ref 30.0–36.0)
MCV: 84 fl (ref 78.0–100.0)
Monocytes Absolute: 0.4 K/uL (ref 0.1–1.0)
Monocytes Relative: 7.6 % (ref 3.0–12.0)
Neutro Abs: 2.6 K/uL (ref 1.4–7.7)
Neutrophils Relative %: 46 % (ref 43.0–77.0)
Platelets: 269 K/uL (ref 150.0–400.0)
RBC: 4.21 Mil/uL (ref 3.87–5.11)
RDW: 12.9 % (ref 11.5–15.5)
WBC: 5.7 K/uL (ref 4.0–10.5)

## 2024-06-09 LAB — COMPREHENSIVE METABOLIC PANEL WITH GFR
ALT: 13 U/L (ref 0–35)
AST: 18 U/L (ref 0–37)
Albumin: 4.2 g/dL (ref 3.5–5.2)
Alkaline Phosphatase: 62 U/L (ref 39–117)
BUN: 13 mg/dL (ref 6–23)
CO2: 32 meq/L (ref 19–32)
Calcium: 8.9 mg/dL (ref 8.4–10.5)
Chloride: 105 meq/L (ref 96–112)
Creatinine, Ser: 0.51 mg/dL (ref 0.40–1.20)
GFR: 99.03 mL/min (ref >60.00)
Glucose, Bld: 90 mg/dL (ref 70–99)
Potassium: 3.9 meq/L (ref 3.5–5.1)
Sodium: 143 meq/L (ref 135–145)
Total Bilirubin: 1.3 mg/dL — ABNORMAL HIGH (ref 0.2–1.2)
Total Protein: 6.8 g/dL (ref 6.0–8.3)

## 2024-06-09 LAB — LIPID PANEL
Cholesterol: 203 mg/dL — ABNORMAL HIGH (ref 0–200)
HDL: 56.7 mg/dL (ref >39.00)
LDL Cholesterol: 123 mg/dL — ABNORMAL HIGH (ref 0–99)
NonHDL: 146.68
Total CHOL/HDL Ratio: 4
Triglycerides: 120 mg/dL (ref 0.0–149.0)
VLDL: 24 mg/dL (ref 0.0–40.0)

## 2024-06-09 LAB — TSH: TSH: 0.78 u[IU]/mL (ref 0.35–5.50)

## 2024-06-09 LAB — T4, FREE: Free T4: 1 ng/dL (ref 0.60–1.60)

## 2024-06-09 LAB — VITAMIN D 25 HYDROXY (VIT D DEFICIENCY, FRACTURES): VITD: 30.43 ng/mL (ref 30.00–100.00)

## 2024-06-09 NOTE — Patient Instructions (Addendum)
 Medications changes include :   None    A referral was ordered and someone will call you to schedule an appointment.     Return in about 6 months (around 12/11/2024) for follow up.    Health Maintenance, Female Adopting a healthy lifestyle and getting preventive care are important in promoting health and wellness. Ask your health care provider about: The right schedule for you to have regular tests and exams. Things you can do on your own to prevent diseases and keep yourself healthy. What should I know about diet, weight, and exercise? Eat a healthy diet  Eat a diet that includes plenty of vegetables, fruits, low-fat dairy products, and lean protein. Do not eat a lot of foods that are high in solid fats, added sugars, or sodium. Maintain a healthy weight Body mass index (BMI) is used to identify weight problems. It estimates body fat based on height and weight. Your health care provider can help determine your BMI and help you achieve or maintain a healthy weight. Get regular exercise Get regular exercise. This is one of the most important things you can do for your health. Most adults should: Exercise for at least 150 minutes each week. The exercise should increase your heart rate and make you sweat (moderate-intensity exercise). Do strengthening exercises at least twice a week. This is in addition to the moderate-intensity exercise. Spend less time sitting. Even light physical activity can be beneficial. Watch cholesterol and blood lipids Have your blood tested for lipids and cholesterol at 64 years of age, then have this test every 5 years. Have your cholesterol levels checked more often if: Your lipid or cholesterol levels are high. You are older than 64 years of age. You are at high risk for heart disease. What should I know about cancer screening? Depending on your health history and family history, you may need to have cancer screening at various ages. This may  include screening for: Breast cancer. Cervical cancer. Colorectal cancer. Skin cancer. Lung cancer. What should I know about heart disease, diabetes, and high blood pressure? Blood pressure and heart disease High blood pressure causes heart disease and increases the risk of stroke. This is more likely to develop in people who have high blood pressure readings or are overweight. Have your blood pressure checked: Every 3-5 years if you are 30-61 years of age. Every year if you are 64 years old or older. Diabetes Have regular diabetes screenings. This checks your fasting blood sugar level. Have the screening done: Once every three years after age 28 if you are at a normal weight and have a low risk for diabetes. More often and at a younger age if you are overweight or have a high risk for diabetes. What should I know about preventing infection? Hepatitis B If you have a higher risk for hepatitis B, you should be screened for this virus. Talk with your health care provider to find out if you are at risk for hepatitis B infection. Hepatitis C Testing is recommended for: Everyone born from [birth year] through 1966. Anyone with known risk factors for hepatitis C. Sexually transmitted infections (STIs) Get screened for STIs, including gonorrhea and chlamydia, if: You are sexually active and are younger than 64 years of age. You are older than 64 years of age and your health care provider tells you that you are at risk for this type of infection. Your sexual activity has changed since you were last screened, and you are at  increased risk for chlamydia or gonorrhea. Ask your health care provider if you are at risk. Ask your health care provider about whether you are at high risk for HIV. Your health care provider may recommend a prescription medicine to help prevent HIV infection. If you choose to take medicine to prevent HIV, you should first get tested for HIV. You should then be tested every 3 months  for as long as you are taking the medicine. Pregnancy If you are about to stop having your period (premenopausal) and you may become pregnant, seek counseling before you get pregnant. Take 400 to 800 micrograms (mcg) of folic acid every day if you become pregnant. Ask for birth control (contraception) if you want to prevent pregnancy. Osteoporosis and menopause Osteoporosis is a disease in which the bones lose minerals and strength with aging. This can result in bone fractures. If you are 102 years old or older, or if you are at risk for osteoporosis and fractures, ask your health care provider if you should: Be screened for bone loss. Take a calcium or vitamin D  supplement to lower your risk of fractures. Be given hormone replacement therapy (HRT) to treat symptoms of menopause. Follow these instructions at home: Alcohol use Do not drink alcohol if: Your health care provider tells you not to drink. You are pregnant, may be pregnant, or are planning to become pregnant. If you drink alcohol: Limit how much you have to: 0-1 drink a day. Know how much alcohol is in your drink. In the [place] one drink equals one 12 oz bottle of beer (355 mL), one 5 oz glass of wine (148 mL), or one 1 oz glass of hard liquor (44 mL). Lifestyle Do not use any products that contain nicotine or tobacco. These products include cigarettes, chewing tobacco, and vaping devices, such as e-cigarettes. If you need help quitting, ask your health care provider. Do not use street drugs. Do not share needles. Ask your health care provider for help if you need support or information about quitting drugs. General instructions Schedule regular health, dental, and eye exams. Stay current with your vaccines. Tell your health care provider if: You often feel depressed. You have ever been abused or do not feel safe at home. Summary Adopting a healthy lifestyle and getting preventive care are important in promoting health and  wellness. Follow your health care provider's instructions about healthy diet, exercising, and getting tested or screened for diseases. Follow your health care provider's instructions on monitoring your cholesterol and blood pressure. This information is not intended to replace advice given to you by your health care provider. Make sure you discuss any questions you have with your health care provider. Document Revised: 02/28/2021 Document Reviewed: 02/28/2021 Elsevier Patient Education  2024 [employer].

## 2024-06-09 NOTE — Progress Notes (Unsigned)
 Subjective:    Patient ID: Sharon Stephenson, female    DOB: 29-Sep-1961, 63 y.o.   MRN: [id number]      HPI Sharon Stephenson is here for a Physical exam and her chronic medical problems.  Last year was a stressful year with her mother dying and having to put her dog down.  She feels like she is overall doing well.  She is trying to concentrate on her health.  Was having pain and numbness in right thigh - ortho - MRI ? L2 - had cortisone inj -- different doctor tried injection a little higher - did not help.  Saw podiatrist - discrepancy of leg length -- wearing new inserts.  This will likely help her thigh pain.    Has gained some weight - has not had time to exercise -- she is working out with a trainer and walking on treadmill  Medications and allergies reviewed with patient and updated if appropriate.  Current Outpatient Medications on File Prior to Visit  Medication Sig Dispense Refill   ALPHA LIPOIC ACID PO Take by mouth.     Ascorbic Acid (VITAMIN C) 1000 MG tablet Take 1,000 mg by mouth daily.     azelastine (ASTELIN) 0.1 % nasal spray 1-2 puff in each nostril Nasally Twice a day; Duration: 30 day(s)     Biotin 5 MG CAPS Take by mouth.     Calcium-Magnesium-Vitamin D  (CALCIUM MAGNESIUM PO) Take 1,000 mg by mouth.     EPINEPHrine 0.3 mg/0.3 mL IJ SOAJ injection as directed Injection prn; Duration: 30 days     Fish Oil-Cholecalciferol (OMEGA-3 FISH OIL-VITAMIN D3) 1200-1000 MG-UNIT CAPS Take by mouth daily.     gabapentin  (NEURONTIN ) 300 MG capsule TAKE 1 CAPSULE BY MOUTH TWICE A DAY 180 capsule 3   levocetirizine (XYZAL) 5 MG tablet 1 tablet in the evening Orally Once a day; Duration: 30 day(s)     levothyroxine  (SYNTHROID ) 75 MCG tablet TAKE 1 TABLET BY MOUTH EVERY DAY 90 tablet 3   Magnesium 200 MG TABS Take by mouth daily.     Multiple Vitamin (MULTIVITAMINS PO)      NON FORMULARY Zincl supplement ovc as needed     NON FORMULARY Iron supplement take once a day     PARoxetine  (PAXIL ) 20  MG tablet TAKE 1 TABLET BY MOUTH EVERY DAY 90 tablet 0   Probiotic Product (PROBIOTIC FORMULA) CAPS Take by mouth daily.     Turmeric 500 MG CAPS Take by mouth daily.     No current facility-administered medications on file prior to visit.    Review of Systems  Constitutional:  Negative for fever.  Eyes:  Negative for visual disturbance.  Respiratory:  Negative for cough, shortness of breath and wheezing.   Cardiovascular:  Negative for chest pain, palpitations and leg swelling.  Gastrointestinal:  Negative for abdominal pain, blood in stool, constipation and diarrhea.       No gerd  Genitourinary:  Negative for dysuria.  Musculoskeletal:  Negative for arthralgias and back pain.       Right upper lateral thigh with pain, numbness  Skin:  Negative for rash.  Neurological:  Positive for headaches (posterior head). Negative for light-headedness.  Psychiatric/Behavioral:  Positive for dysphoric mood (controlled). The patient is nervous/anxious (controlled).        Objective:   Vitals:   06/10/24 1324  BP: 122/72  Pulse: 60  Temp: 98.5 F (36.9 C)  SpO2: 97%   Filed Weights   06/10/24  1324  Weight: 130 lb (59 kg)   Body mass index is 24.56 kg/m.  BP Readings from Last 3 Encounters:  06/10/24 122/72  09/26/23 124/70  06/20/23 104/60    Wt Readings from Last 3 Encounters:  06/10/24 130 lb (59 kg)  09/26/23 122 lb (55.3 kg)  06/20/23 125 lb (56.7 kg)       Physical Exam Constitutional: She appears well-developed and well-nourished. No distress.  HENT:  Head: Normocephalic and atraumatic.  Right Ear: External ear normal. Normal ear canal and TM Left Ear: External ear normal.  Normal ear canal and TM Mouth/Throat: Oropharynx is clear and moist.  Eyes: Conjunctivae normal.  Neck: Neck supple. No tracheal deviation present. No thyromegaly present.  No carotid bruit  Cardiovascular: Normal rate, regular rhythm and normal heart sounds.   No murmur heard.  No  edema. Pulmonary/Chest: Effort normal and breath sounds normal. No respiratory distress. She has no wheezes. She has no rales.  Breast: deferred   Abdominal: Soft. She exhibits no distension. There is no tenderness.  Lymphadenopathy: She has no cervical adenopathy.  Skin: Skin is warm and dry. She is not diaphoretic.  Psychiatric: She has a normal mood and affect. Her behavior is normal.     Lab Results  Component Value Date   WBC 5.7 06/09/2024   HGB 12.0 06/09/2024   HCT 35.4 (L) 06/09/2024   PLT 269.0 06/09/2024   GLUCOSE 90 06/09/2024   CHOL 203 (H) 06/09/2024   TRIG 120.0 06/09/2024   HDL 56.70 06/09/2024   LDLDIRECT 105.3 08/03/2010   LDLCALC 123 (H) 06/09/2024   ALT 13 06/09/2024   AST 18 06/09/2024   NA 143 06/09/2024   K 3.9 06/09/2024   CL 105 06/09/2024   CREATININE 0.51 06/09/2024   BUN 13 06/09/2024   CO2 32 06/09/2024   TSH 0.78 06/09/2024   HGBA1C 5.1 11/19/2014    The 10-year ASCVD risk score (Arnett DK, et al., 2019) is: 4.1%   Values used to calculate the score:     Age: 33 years     Clincally relevant sex: Female     Is Non-Hispanic African American: No     Diabetic: No     Tobacco smoker: No     Systolic Blood Pressure: 122 mmHg     Is BP treated: No     HDL Cholesterol: 56.7 mg/dL     Total Cholesterol: 203 mg/dL      Assessment & Plan:   Physical exam: Screening blood work  ordered Exercise   working out with a Psychologist, educational and walking on treadmill [employer]  normal-working on losing some weight Substance abuse  none   Reviewed recommended immunizations.  Will schedule nurse visit for Tdap and pneumonia   Health Maintenance  Topic Date Due   Pneumococcal Vaccine: 50+ Years (1 of 1 - PCV) Never done   DTaP/Tdap/Td (3 - Td or Tdap) 11/28/2023   INFLUENZA VACCINE  05/23/2024   MAMMOGRAM  07/12/2024   Cervical Cancer Screening (HPV/Pap Cotest)  07/19/2027   Colonoscopy  06/19/2028   COVID-19 Vaccine  Completed   Zoster Vaccines- Shingrix   Completed   Hepatitis B Vaccines 19-59 Average Risk  Aged Out   HPV VACCINES  Aged Out   Meningococcal B Vaccine  Aged Out   Hepatitis C Screening  Discontinued   HIV Screening  Discontinued          See Problem List for Assessment and Plan of chronic medical problems.

## 2024-06-10 ENCOUNTER — Ambulatory Visit: Admitting: Internal Medicine

## 2024-06-10 VITALS — BP 122/72 | HR 60 | Temp 98.5°F | Ht 61.0 in | Wt 130.0 lb

## 2024-06-10 DIAGNOSIS — E559 Vitamin D deficiency, unspecified: Secondary | ICD-10-CM

## 2024-06-10 DIAGNOSIS — G6289 Other specified polyneuropathies: Secondary | ICD-10-CM

## 2024-06-10 DIAGNOSIS — M85851 Other specified disorders of bone density and structure, right thigh: Secondary | ICD-10-CM

## 2024-06-10 DIAGNOSIS — F411 Generalized anxiety disorder: Secondary | ICD-10-CM

## 2024-06-10 DIAGNOSIS — F3289 Other specified depressive episodes: Secondary | ICD-10-CM | POA: Diagnosis not present

## 2024-06-10 DIAGNOSIS — Z0000 Encounter for general adult medical examination without abnormal findings: Secondary | ICD-10-CM

## 2024-06-10 DIAGNOSIS — M85852 Other specified disorders of bone density and structure, left thigh: Secondary | ICD-10-CM

## 2024-06-10 DIAGNOSIS — L309 Dermatitis, unspecified: Secondary | ICD-10-CM

## 2024-06-10 DIAGNOSIS — Z23 Encounter for immunization: Secondary | ICD-10-CM

## 2024-06-10 DIAGNOSIS — E039 Hypothyroidism, unspecified: Secondary | ICD-10-CM

## 2024-06-10 NOTE — Assessment & Plan Note (Signed)
 Chronic Clinically euthyroid TFTs in the normal range Continue current dose of levothyroxine  75 mcg daily

## 2024-06-10 NOTE — Assessment & Plan Note (Signed)
 Chronic (D level in the Continue vitamin D  supplementation daily

## 2024-06-10 NOTE — Assessment & Plan Note (Addendum)
 Chronic Intermittent Related to something in the garden - not sure the exact cause Uses steroid based otc cream

## 2024-06-10 NOTE — Assessment & Plan Note (Signed)
 Chronic  Dexa with gyn Stressed regular exercise Taking vitamin d  - level on low-normal side -- increase vitamin d  intake slightly Increase calcium in diet

## 2024-06-10 NOTE — Assessment & Plan Note (Addendum)
 Chronic Controlled-doing very well after having a very stressful year last year Continue paroxetine  20 mg daily Continue regular exercise

## 2024-06-10 NOTE — Assessment & Plan Note (Signed)
 Chronic Overall controlled Continue paroxetine  20 mg daily

## 2024-06-10 NOTE — Assessment & Plan Note (Signed)
Chronic Controlled Continue gabapentin 300 mg twice daily

## 2024-06-16 ENCOUNTER — Ambulatory Visit (INDEPENDENT_AMBULATORY_CARE_PROVIDER_SITE_OTHER)

## 2024-06-16 DIAGNOSIS — Z23 Encounter for immunization: Secondary | ICD-10-CM | POA: Diagnosis not present

## 2024-06-16 NOTE — Progress Notes (Signed)
 Tdap and Prevnar20 given w/o complications

## 2024-06-18 DIAGNOSIS — M2021 Hallux rigidus, right foot: Secondary | ICD-10-CM | POA: Diagnosis not present

## 2024-07-06 ENCOUNTER — Other Ambulatory Visit: Payer: Self-pay | Admitting: Internal Medicine

## 2024-09-02 DIAGNOSIS — Z1231 Encounter for screening mammogram for malignant neoplasm of breast: Secondary | ICD-10-CM | POA: Diagnosis not present

## 2024-09-02 DIAGNOSIS — Z01419 Encounter for gynecological examination (general) (routine) without abnormal findings: Secondary | ICD-10-CM | POA: Diagnosis not present

## 2024-12-11 ENCOUNTER — Ambulatory Visit: Admitting: Internal Medicine
